# Patient Record
Sex: Female | Born: 1965 | Race: Black or African American | Hispanic: No | Marital: Married | Smoking: Never smoker
Health system: Southern US, Community
[De-identification: ages and names within clinical notes are randomized; demographics above are authoritative.]

## PROBLEM LIST (undated history)

## (undated) DIAGNOSIS — R51 Headache: Secondary | ICD-10-CM

## (undated) DIAGNOSIS — T7840XA Allergy, unspecified, initial encounter: Secondary | ICD-10-CM

## (undated) DIAGNOSIS — C50919 Malignant neoplasm of unspecified site of unspecified female breast: Secondary | ICD-10-CM

## (undated) DIAGNOSIS — Z923 Personal history of irradiation: Secondary | ICD-10-CM

## (undated) DIAGNOSIS — Z9009 Acquired absence of other part of head and neck: Secondary | ICD-10-CM

## (undated) DIAGNOSIS — K645 Perianal venous thrombosis: Secondary | ICD-10-CM

## (undated) HISTORY — DX: Headache: R51

## (undated) HISTORY — DX: Perianal venous thrombosis: K64.5

---
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
## 1998-03-16 ENCOUNTER — Other Ambulatory Visit: Admission: RE | Admit: 1998-03-16 | Discharge: 1998-03-16 | Payer: Self-pay | Admitting: Internal Medicine

## 1998-03-30 ENCOUNTER — Ambulatory Visit (HOSPITAL_COMMUNITY): Admission: RE | Admit: 1998-03-30 | Discharge: 1998-03-30 | Payer: Self-pay | Admitting: Internal Medicine

## 1998-04-02 ENCOUNTER — Other Ambulatory Visit: Admission: RE | Admit: 1998-04-02 | Discharge: 1998-04-02 | Payer: Self-pay | Admitting: General Surgery

## 1999-09-15 ENCOUNTER — Other Ambulatory Visit: Admission: RE | Admit: 1999-09-15 | Discharge: 1999-09-15 | Payer: Self-pay | Admitting: Obstetrics and Gynecology

## 2001-01-08 ENCOUNTER — Other Ambulatory Visit: Admission: RE | Admit: 2001-01-08 | Discharge: 2001-01-08 | Payer: Self-pay | Admitting: Obstetrics and Gynecology

## 2002-02-24 ENCOUNTER — Other Ambulatory Visit: Admission: RE | Admit: 2002-02-24 | Discharge: 2002-02-24 | Payer: Self-pay | Admitting: Obstetrics and Gynecology

## 2003-07-13 ENCOUNTER — Other Ambulatory Visit: Admission: RE | Admit: 2003-07-13 | Discharge: 2003-07-13 | Payer: Self-pay | Admitting: Obstetrics and Gynecology

## 2004-02-19 ENCOUNTER — Encounter: Admission: RE | Admit: 2004-02-19 | Discharge: 2004-02-19 | Payer: Self-pay | Admitting: Orthopedic Surgery

## 2004-09-09 ENCOUNTER — Encounter: Admission: RE | Admit: 2004-09-09 | Discharge: 2004-09-09 | Payer: Self-pay | Admitting: Obstetrics and Gynecology

## 2004-09-15 ENCOUNTER — Encounter: Admission: RE | Admit: 2004-09-15 | Discharge: 2004-09-15 | Payer: Self-pay | Admitting: Obstetrics and Gynecology

## 2004-11-13 DIAGNOSIS — E890 Postprocedural hypothyroidism: Secondary | ICD-10-CM

## 2004-11-13 HISTORY — DX: Postprocedural hypothyroidism: E89.0

## 2004-11-23 ENCOUNTER — Ambulatory Visit (HOSPITAL_COMMUNITY): Admission: RE | Admit: 2004-11-23 | Discharge: 2004-11-23 | Payer: Self-pay | Admitting: Internal Medicine

## 2005-07-05 ENCOUNTER — Other Ambulatory Visit: Admission: RE | Admit: 2005-07-05 | Discharge: 2005-07-05 | Payer: Self-pay | Admitting: Interventional Radiology

## 2005-07-05 ENCOUNTER — Encounter: Admission: RE | Admit: 2005-07-05 | Discharge: 2005-07-05 | Payer: Self-pay | Admitting: Endocrinology

## 2005-07-05 ENCOUNTER — Encounter (INDEPENDENT_AMBULATORY_CARE_PROVIDER_SITE_OTHER): Payer: Self-pay | Admitting: *Deleted

## 2005-11-02 ENCOUNTER — Encounter (INDEPENDENT_AMBULATORY_CARE_PROVIDER_SITE_OTHER): Payer: Self-pay | Admitting: Specialist

## 2005-11-02 ENCOUNTER — Ambulatory Visit (HOSPITAL_COMMUNITY): Admission: RE | Admit: 2005-11-02 | Discharge: 2005-11-03 | Payer: Self-pay | Admitting: General Surgery

## 2005-11-02 HISTORY — PX: THYROID SURGERY: SHX805

## 2005-11-13 HISTORY — PX: BREAST CYST EXCISION: SHX579

## 2005-12-08 ENCOUNTER — Encounter: Admission: RE | Admit: 2005-12-08 | Discharge: 2005-12-08 | Payer: Self-pay | Admitting: Obstetrics and Gynecology

## 2005-12-08 ENCOUNTER — Encounter (INDEPENDENT_AMBULATORY_CARE_PROVIDER_SITE_OTHER): Payer: Self-pay | Admitting: Specialist

## 2005-12-08 HISTORY — PX: BREAST BIOPSY: SHX20

## 2006-02-23 ENCOUNTER — Ambulatory Visit (HOSPITAL_BASED_OUTPATIENT_CLINIC_OR_DEPARTMENT_OTHER): Admission: RE | Admit: 2006-02-23 | Discharge: 2006-02-23 | Payer: Self-pay | Admitting: General Surgery

## 2006-02-23 ENCOUNTER — Encounter (INDEPENDENT_AMBULATORY_CARE_PROVIDER_SITE_OTHER): Payer: Self-pay | Admitting: *Deleted

## 2006-02-23 ENCOUNTER — Encounter: Admission: RE | Admit: 2006-02-23 | Discharge: 2006-02-23 | Payer: Self-pay | Admitting: General Surgery

## 2007-03-22 ENCOUNTER — Ambulatory Visit (HOSPITAL_COMMUNITY): Admission: RE | Admit: 2007-03-22 | Discharge: 2007-03-22 | Payer: Self-pay | Admitting: Endocrinology

## 2009-03-31 ENCOUNTER — Encounter: Admission: RE | Admit: 2009-03-31 | Discharge: 2009-03-31 | Payer: Self-pay | Admitting: Obstetrics and Gynecology

## 2009-10-06 ENCOUNTER — Encounter: Admission: RE | Admit: 2009-10-06 | Discharge: 2009-10-06 | Payer: Self-pay | Admitting: Obstetrics and Gynecology

## 2010-03-04 ENCOUNTER — Encounter: Admission: RE | Admit: 2010-03-04 | Discharge: 2010-03-04 | Payer: Self-pay | Admitting: Obstetrics and Gynecology

## 2010-03-09 ENCOUNTER — Encounter: Admission: RE | Admit: 2010-03-09 | Discharge: 2010-03-09 | Payer: Self-pay | Admitting: Obstetrics and Gynecology

## 2010-03-30 HISTORY — PX: BREAST EXCISIONAL BIOPSY: SUR124

## 2010-12-04 ENCOUNTER — Encounter: Payer: Self-pay | Admitting: Obstetrics and Gynecology

## 2011-02-03 ENCOUNTER — Ambulatory Visit: Payer: Self-pay | Admitting: Internal Medicine

## 2011-03-31 NOTE — Op Note (Signed)
Brandi Lewis, Brandi Lewis            ACCOUNT NO.:  [id number]   MEDICAL RECORD NO.:  [id number]          PATIENT TYPE:  AMB   LOCATION:  DSC                          FACILITY:  [facility]   PHYSICIAN:  Leonie Man, M.D.   DATE OF BIRTH:  09/18/1966   DATE OF PROCEDURE:  02/23/2006  DATE OF DISCHARGE:                                 OPERATIVE REPORT   PREOPERATIVE DIAGNOSIS:  Lesion, right breast.  Rule out carcinoma.   POSTOPERATIVE DIAGNOSIS:  Lesion, right breast.  Rule out carcinoma,  pathology pending.   PROCEDURE:  Lumpectomy, right breast.   SURGEON:  Leonie Man, M.D.   ASSISTANT:  Operating room technician.   ANESTHESIA:  General.   SPECIMENS:  To the pathology laboratory are breast tissue.  The lesion on  specimen mammography was seen, along with the localizing clip.   ESTIMATED BLOOD LOSS:  Minimal.   COMPLICATIONS:  None.   DISPOSITION:  Postoperatively the patient was returned to the PACU in  excellent condition.   INDICATIONS FOR PROCEDURE:  [name] is a 45 year old female with an  abnormal mammogram requiring a biopsy.  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  She comes to the operating room now for an excision of  this area with the papilloma, to rule out the possibility of a papillary  carcinoma.  She understands the risks and potential benefits of the surgery,  and gives her consent to same.   DESCRIPTION OF PROCEDURE:  Following the induction of satisfactory general  anesthesia, with the patient positioned supinely, the right breast is  prepped and draped to be included in the sterile operative field.  A  localizing needle which was placed at the breast center prior to the patient  coming to the operating room, is located in the upper outer quadrant of the  breast and films indicated a straight path to the lesion, which was located  more centrally.  A transverse incision is made adjacent to the localizing  wire, extending from the  wire towards the areola border.  This is deepened  through the skin and subcutaneous tissues.  Flaps were raised superiorly,  medially, inferiorly and laterally and dissection down into the breast  tissue, carrying the dissection down to the chest wall was carried out.  This removed the entire area of suspect breast tissue, and this was  forwarded for pathologic evaluation.  The hemostasis was then obtained. with  electrocautery and all areas of dissection checked for hemostasis and noted  to be dry.  The breast tissues were reapproximated with interrupted #2-0  Vicryl sutures.  The subcutaneous tissues were closed with #3-0 Vicryl  sutures, after the sponge, instrument and sharp counts were verified.  The  skin was closed with #5-0 Monocryl and then reinforced with Steri-Strips.  Sterile dressings applied.  The anesthetic was reversed.   The patient was removed from the operating room to the recovery room in  stable condition, having tolerated the procedure well.      Leonie Man, M.D.  Electronically Signed     PB/MEDQ  D:  02/23/2006  T:  02/23/2006  Job:  [id number]

## 2011-03-31 NOTE — Op Note (Signed)
NAMESHERONDA, Brandi Lewis            ACCOUNT NO.:  [id number]   MEDICAL RECORD NO.:  [id number]          PATIENT TYPE:  OIB   LOCATION:  1616                         FACILITY:  [facility]   PHYSICIAN:  Leonie Man, M.D.   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  Final pathology is pending.   PROCEDURE:  Thyroid exploration with isthmusectomy.   SURGEON:  Dr. Lurene Shadow   ASSISTANT:  Dr. Lebron Conners.   ANESTHESIA:  General.   Note, this patient is a 45 year old female, who presents with an enlarging  nodule in the isthmus of the thyroid extending into the left lobe of the  thyroid.  Fine-needle aspiration biopsy of this lesion shows a follicular  lesion with multiple clusters of Hurthle cells.  The patient comes to the  operating room now for left thyroid lobectomy, possible total thyroidectomy.  I discussed with her the risks and potential benefits of surgery including  the risk of hypoparathyroidism, recurrent laryngeal nerve injury, bleeding  and infection among others.  She understands and gives her consent to  surgery.   PROCEDURE:  Following the induction of satisfactory general anesthesia, the  patient is positioned supinely.  The head is slightly extended, and the neck  and chest were prepped and draped to be included in a sterile operative  field.  Transverse collar incision was carried down through the skin and  subcutaneous tissue and across the platysma muscle.  The superior flap was  raised up to the thyroid cartilage and an inferior flap carried down to the  sternal notch.  The strap muscles are opened in the midline.  The dissection  is carried over the isthmus and over  into the left lobe.  Both the right and  left lobes were thoroughly inspected.  There were no additional nodules  noted.  There were no nodes felt within the anterior compartment.  Because  the nodule was essentially occupying  the isthmus of the thyroid, I then  decided that I would remove just the isthmus with portions of the right and  left lobe to get margins around this mass.  This was done by dissecting  medially onto the thyroid and maintaining hemostasis with electrocautery and  clips.  The medial aspect of the left lobe was then elevated, and I  transected the left lobe, leaving approximately a 1 cm margin of what  appeared to be normal thyroid lateral to the lesion in the isthmus.  The  dissection was then carried over the trachea and carried into the region of  the right lobe where a similar margin of approximately 1 cm was carried over  into the right lobe.  The isthmus of the thyroid was then forwarded for  pathologic evaluation and on frozen section, this was noted to be a  follicular lesion with multiple clusters  of Hurthle cells.  There was no  evidence to suggest malignancy.  All areas of dissection within the neck  were then checked for hemostasis and noted to be dry.  Sponge and instrument  counts were verified.  I placed a small Surgicel patch over the trachea.  The midline strap muscles were then closed with interrupted sutures of 3-0  Vicryl.  The platysma muscle and subcutaneous tissues closed with  interrupted 3-0 Vicryl sutures, and the skin was closed with a 5-0  Monocryl suture and then reinforced with Steri-Strips.  Sterile dressings  were then applied, the anesthetic reversed, and the patient removed from the  operating room to the recovery room in stable condition.  She tolerated the  procedure well.      Leonie Man, M.D.  Electronically Signed     PB/MEDQ  D:  11/02/2005  T:  11/06/2005  Job:  [id number]

## 2011-06-01 ENCOUNTER — Other Ambulatory Visit: Payer: Self-pay | Admitting: Obstetrics

## 2011-06-01 DIAGNOSIS — N6325 Unspecified lump in the left breast, overlapping quadrants: Secondary | ICD-10-CM

## 2011-06-08 ENCOUNTER — Other Ambulatory Visit: Payer: Self-pay

## 2011-12-04 ENCOUNTER — Encounter (INDEPENDENT_AMBULATORY_CARE_PROVIDER_SITE_OTHER): Payer: Self-pay | Admitting: Surgery

## 2011-12-04 ENCOUNTER — Ambulatory Visit (INDEPENDENT_AMBULATORY_CARE_PROVIDER_SITE_OTHER): Payer: BC Managed Care – PPO | Admitting: Surgery

## 2011-12-04 VITALS — BP 114/68 | HR 68 | Temp 97.8°F | Resp 16 | Ht 60.0 in | Wt 109.8 lb

## 2011-12-04 DIAGNOSIS — R51 Headache: Secondary | ICD-10-CM | POA: Insufficient documentation

## 2011-12-04 DIAGNOSIS — R519 Headache, unspecified: Secondary | ICD-10-CM | POA: Insufficient documentation

## 2011-12-04 DIAGNOSIS — K645 Perianal venous thrombosis: Secondary | ICD-10-CM

## 2011-12-04 NOTE — Progress Notes (Signed)
Subjective:     Patient ID: Brandi Lewis, female   DOB: 1966/08/02, 46 y.o.   MRN: [id number]  HPI She presents with another thrombosed hemorrhoid. The developed over the weekend. She has moderate perianal discomfort  Review of Systems     Objective:   Physical Exam On exam, there is a thrombosed hemorrhoid with a large amount of edema. I anesthetized the area of lidocaine with epinephrine and drained several clots. I then packed the wound gauze    Assessment:     Thrombosed external hemorrhoid    Plan:     She will continue her sitz baths. I wrote her for Percocet lidocaine and Phenergan. I will see her back next week

## 2011-12-07 ENCOUNTER — Encounter (INDEPENDENT_AMBULATORY_CARE_PROVIDER_SITE_OTHER): Payer: Self-pay | Admitting: General Surgery

## 2011-12-13 ENCOUNTER — Ambulatory Visit (INDEPENDENT_AMBULATORY_CARE_PROVIDER_SITE_OTHER): Payer: BC Managed Care – PPO | Admitting: Surgery

## 2011-12-13 ENCOUNTER — Encounter (INDEPENDENT_AMBULATORY_CARE_PROVIDER_SITE_OTHER): Payer: Self-pay | Admitting: Surgery

## 2011-12-13 VITALS — BP 130/84 | HR 80 | Temp 98.3°F | Resp 18 | Ht 60.0 in | Wt 114.2 lb

## 2011-12-13 DIAGNOSIS — Z09 Encounter for follow-up examination after completed treatment for conditions other than malignant neoplasm: Secondary | ICD-10-CM

## 2011-12-13 NOTE — Progress Notes (Signed)
Subjective:     Patient ID: Brandi Lewis, female   DOB: June 15, 1966, 46 y.o.   MRN: [id number]  HPI She is here for a followup visit status post incision and drainage of thrombosed external hemorrhoids last week. She is still having moderate discomfort.  Review of Systems     Objective:   Physical Exam On exam, there is still swelling of the hemorrhoidal tissue but it is less than last week and there is no evidence of rethrombosis    Assessment:     Patient status post incision and drainage of thrombosed hemorrhoid    Plan:     I will start her on Analpram. She will continue sitz baths. I also reviewed for Percocet. She will also continue stool softeners. I will see her back in 2 weeks

## 2012-01-01 ENCOUNTER — Encounter (INDEPENDENT_AMBULATORY_CARE_PROVIDER_SITE_OTHER): Payer: Self-pay | Admitting: Surgery

## 2012-01-01 ENCOUNTER — Ambulatory Visit (INDEPENDENT_AMBULATORY_CARE_PROVIDER_SITE_OTHER): Payer: BC Managed Care – PPO | Admitting: Surgery

## 2012-01-01 VITALS — BP 121/77 | HR 85 | Temp 98.6°F | Resp 14 | Ht 60.0 in | Wt 109.4 lb

## 2012-01-01 DIAGNOSIS — K645 Perianal venous thrombosis: Secondary | ICD-10-CM

## 2012-01-01 NOTE — Progress Notes (Signed)
Subjective:     Patient ID: Brandi Lewis, female   DOB: 08-Apr-1966, 46 y.o.   MRN: [id number]  HPI She is here for a followup visit. She is still having discomfort with bowel movements she reports it is less. She remains on a stool softener. She's minimal narcotics  Review of Systems     Objective:   Physical Exam    On exam, there is some skin tagging but otherwise minimal swelling and no open wounds Assessment:     Patient status post incision and drainage of thrombosed external hemorrhoid    Plan:     I wrote her for nitroglycerin ointment. She will continue stool softeners. I will see her back as needed and

## 2012-08-21 ENCOUNTER — Other Ambulatory Visit: Payer: Self-pay

## 2012-08-21 DIAGNOSIS — Z1231 Encounter for screening mammogram for malignant neoplasm of breast: Secondary | ICD-10-CM

## 2012-08-27 ENCOUNTER — Emergency Department (HOSPITAL_COMMUNITY)
Admission: EM | Admit: 2012-08-27 | Discharge: 2012-08-27 | Disposition: A | Discharge status: Home / Self Care | Payer: BC Managed Care – PPO | Attending: Emergency Medicine | Admitting: Emergency Medicine

## 2012-08-27 ENCOUNTER — Encounter (HOSPITAL_COMMUNITY): Payer: Self-pay | Admitting: Emergency Medicine

## 2012-08-27 DIAGNOSIS — N949 Unspecified condition associated with female genital organs and menstrual cycle: Secondary | ICD-10-CM | POA: Insufficient documentation

## 2012-08-27 DIAGNOSIS — N912 Amenorrhea, unspecified: Secondary | ICD-10-CM | POA: Insufficient documentation

## 2012-08-27 DIAGNOSIS — R102 Pelvic and perineal pain unspecified side: Secondary | ICD-10-CM

## 2012-08-27 DIAGNOSIS — R10816 Epigastric abdominal tenderness: Secondary | ICD-10-CM | POA: Insufficient documentation

## 2012-08-27 DIAGNOSIS — R109 Unspecified abdominal pain: Secondary | ICD-10-CM | POA: Insufficient documentation

## 2012-08-27 DIAGNOSIS — Z79899 Other long term (current) drug therapy: Secondary | ICD-10-CM | POA: Insufficient documentation

## 2012-08-27 LAB — WET PREP, GENITAL
Trich, Wet Prep: NONE SEEN
Yeast Wet Prep HPF POC: NONE SEEN

## 2012-08-27 LAB — CBC WITH DIFFERENTIAL/PLATELET
Basophils Absolute: 0.1 10*3/uL (ref 0.0–0.1)
Basophils Relative: 1 % (ref 0–1)
Eosinophils Relative: 1 % (ref 0–5)
HCT: 43.5 % (ref 36.0–46.0)
MCHC: 35.2 g/dL (ref 30.0–36.0)
MCV: 86.8 fL (ref 78.0–100.0)
Monocytes Absolute: 0.4 10*3/uL (ref 0.1–1.0)
Neutro Abs: 3.3 10*3/uL (ref 1.7–7.7)
RDW: 13.1 % (ref 11.5–15.5)

## 2012-08-27 LAB — URINALYSIS, MICROSCOPIC ONLY
Bilirubin Urine: NEGATIVE
Ketones, ur: NEGATIVE mg/dL
Nitrite: NEGATIVE
Protein, ur: NEGATIVE mg/dL
Urobilinogen, UA: 0.2 mg/dL (ref 0.0–1.0)

## 2012-08-27 LAB — BASIC METABOLIC PANEL
BUN: 9 mg/dL (ref 6–23)
CO2: 21 mEq/L (ref 19–32)
Chloride: 103 mEq/L (ref 96–112)
Creatinine, Ser: 0.92 mg/dL (ref 0.50–1.10)

## 2012-08-27 NOTE — ED Notes (Signed)
Pt states that she has been having lower abd cramping for 2-3 days.  Pt does not get periods due to the birth control she is on.  Denies frequency/dysuria.  Denies NVD.

## 2012-08-27 NOTE — ED Provider Notes (Signed)
History     CSN: [id number]  Arrival date & time 08/27/12  1745   First MD Initiated Contact with Patient 08/27/12 2017      Chief Complaint  Patient presents with  . Abdominal Pain    (Consider location/radiation/quality/duration/timing/severity/associated sxs/prior treatment) HPI Complains of crampy suprapubic pain, nonradiating onset 3 days ago pain for a few seconds at a time goes away for several minutes. No anorexia no vomiting. Last bowel movement today, normal pain is slightly improved today over yesterday. No fever nothing makes symptoms better or worse no treatment prior to coming here patient is pain-free presently. No vaginal discharge no urinary symptoms no other associated symptoms Past Medical History  Diagnosis Date  . Headache     migraine  . Thrombosed hemorrhoids     Past Surgical History  Procedure Date  . Breast cyst excision 2007    rt breast  . Thyroid surgery 11/02/2005    thyroid exploration with isthmusectomy    Family History  Problem Relation Age of Onset  . Hypertension Mother   . Cancer Father     bone, prostate  . Cancer Maternal Grandfather     colon    History  Substance Use Topics  . Smoking status: Never Smoker   . Smokeless tobacco: Never Used  . Alcohol Use: No    OB History    Grav Para Term Preterm Abortions TAB SAB Ect Mult Living                  Review of Systems  Constitutional: Negative.   HENT: Negative.   Respiratory: Negative.   Cardiovascular: Negative.   Gastrointestinal: Positive for abdominal pain.  Genitourinary:       Amenorrhea  Musculoskeletal: Negative.   Skin: Negative.   Neurological: Negative.   Hematological: Negative.   Psychiatric/Behavioral: Negative.   All other systems reviewed and are negative.    Allergies  Codeine  Home Medications   Current Outpatient Rx  Name Route Sig Dispense Refill  . NORETHINDRONE ACET-ETHINYL EST 1-20 MG-MCG PO TABS Oral Take 1 tablet by mouth  daily.    . TOPIRAMATE 25 MG PO TABS Oral Take 75 mg by mouth daily.       BP 125/72  Pulse 91  Temp 98.7 F (37.1 C) (Oral)  Resp 21  SpO2 100%  Physical Exam  Nursing note and vitals reviewed. Constitutional: She appears well-developed and well-nourished.  HENT:  Head: Normocephalic and atraumatic.  Eyes: Conjunctivae normal are normal. Pupils are equal, round, and reactive to light.  Neck: Neck supple. No tracheal deviation present. No thyromegaly present.  Cardiovascular: Normal rate and regular rhythm.   No murmur heard. Pulmonary/Chest: Effort normal and breath sounds normal.  Abdominal: Soft. Bowel sounds are normal. She exhibits no distension.       Minimal suprapubic tenderness  Genitourinary:       No external lesion.   No CMT os closed . No adnexal masses or tenderness  Musculoskeletal: Normal range of motion. She exhibits no edema and no tenderness.  Neurological: She is alert. Coordination normal.  Skin: Skin is warm and dry. No rash noted.  Psychiatric: She has a normal mood and affect.    ED Course  Procedures (including critical care time)  Labs Reviewed  CBC WITH DIFFERENTIAL - Abnormal; Notable for the following:    Hemoglobin 15.3 (*)     All other components within normal limits  URINALYSIS, MICROSCOPIC ONLY - Abnormal; Notable for the following:  APPearance CLOUDY (*)     Leukocytes, UA MODERATE (*)     Bacteria, UA MANY (*)     Squamous Epithelial / LPF FEW (*)     All other components within normal limits  BASIC METABOLIC PANEL - Abnormal; Notable for the following:    GFR calc non Af Amer 73 (*)     GFR calc Af Amer 85 (*)     All other components within normal limits  POCT PREGNANCY, URINE   No results found. Results for orders placed during the hospital encounter of 08/27/12  CBC WITH DIFFERENTIAL      Component Value Range   WBC 6.1  4.0 - 10.5 K/uL   RBC 5.01  3.87 - 5.11 MIL/uL   Hemoglobin 15.3 (*) 12.0 - 15.0 g/dL   HCT 16.1   09.6 - 04.5 %   MCV 86.8  78.0 - 100.0 fL   MCH 30.5  26.0 - 34.0 pg   MCHC 35.2  30.0 - 36.0 g/dL   RDW 40.9  81.1 - 91.4 %   Platelets 234  150 - 400 K/uL   Neutrophils Relative 55  43 - 77 %   Neutro Abs 3.3  1.7 - 7.7 K/uL   Lymphocytes Relative 37  12 - 46 %   Lymphs Abs 2.2  0.7 - 4.0 K/uL   Monocytes Relative 6  3 - 12 %   Monocytes Absolute 0.4  0.1 - 1.0 K/uL   Eosinophils Relative 1  0 - 5 %   Eosinophils Absolute 0.1  0.0 - 0.7 K/uL   Basophils Relative 1  0 - 1 %   Basophils Absolute 0.1  0.0 - 0.1 K/uL  URINALYSIS, MICROSCOPIC ONLY      Component Value Range   Color, Urine YELLOW  YELLOW   APPearance CLOUDY (*) CLEAR   Specific Gravity, Urine 1.010  1.005 - 1.030   pH 6.5  5.0 - 8.0   Glucose, UA NEGATIVE  NEGATIVE mg/dL   Hgb urine dipstick NEGATIVE  NEGATIVE   Bilirubin Urine NEGATIVE  NEGATIVE   Ketones, ur NEGATIVE  NEGATIVE mg/dL   Protein, ur NEGATIVE  NEGATIVE mg/dL   Urobilinogen, UA 0.2  0.0 - 1.0 mg/dL   Nitrite NEGATIVE  NEGATIVE   Leukocytes, UA MODERATE (*) NEGATIVE   WBC, UA 3-6  <3 WBC/hpf   Bacteria, UA MANY (*) RARE   Squamous Epithelial / LPF FEW (*) RARE  BASIC METABOLIC PANEL      Component Value Range   Sodium 136  135 - 145 mEq/L   Potassium 3.7  3.5 - 5.1 mEq/L   Chloride 103  96 - 112 mEq/L   CO2 21  19 - 32 mEq/L   Glucose, Bld 88  70 - 99 mg/dL   BUN 9  6 - 23 mg/dL   Creatinine, Ser 7.82  0.50 - 1.10 mg/dL   Calcium 9.6  8.4 - 95.6 mg/dL   GFR calc non Af Amer 73 (*) >90 mL/min   GFR calc Af Amer 85 (*) >90 mL/min  POCT PREGNANCY, URINE      Component Value Range   Preg Test, Ur NEGATIVE  NEGATIVE  WET PREP, GENITAL      Component Value Range   Yeast Wet Prep HPF POC NONE SEEN  NONE SEEN   Trich, Wet Prep NONE SEEN  NONE SEEN   Clue Cells Wet Prep HPF POC NONE SEEN  NONE SEEN   WBC, Wet Prep  HPF POC RARE (*) NONE SEEN   No results found.   No diagnosis found.   10:25 PM patient resting comfortably, asymptomatic MDM    Pain felt to be nonspecific plan ibuprofen for pain. She is to keep her scheduled appointment with [facility] in 10 days Medicine nonspecific pelvic pain        Doug Sou, MD 08/27/12 2231

## 2012-09-18 ENCOUNTER — Ambulatory Visit
Admission: RE | Admit: 2012-09-18 | Discharge: 2012-09-18 | Disposition: A | Discharge status: Home / Self Care | Payer: BC Managed Care – PPO | Source: Ambulatory Visit

## 2012-09-18 DIAGNOSIS — Z1231 Encounter for screening mammogram for malignant neoplasm of breast: Secondary | ICD-10-CM

## 2013-04-18 ENCOUNTER — Emergency Department (HOSPITAL_COMMUNITY)
Admission: EM | Admit: 2013-04-18 | Discharge: 2013-04-18 | Disposition: A | Discharge status: Home / Self Care | Payer: BC Managed Care – PPO | Attending: Emergency Medicine | Admitting: Emergency Medicine

## 2013-04-18 ENCOUNTER — Encounter (HOSPITAL_COMMUNITY): Payer: Self-pay | Admitting: *Deleted

## 2013-04-18 DIAGNOSIS — G43909 Migraine, unspecified, not intractable, without status migrainosus: Secondary | ICD-10-CM | POA: Insufficient documentation

## 2013-04-18 DIAGNOSIS — Z8679 Personal history of other diseases of the circulatory system: Secondary | ICD-10-CM | POA: Insufficient documentation

## 2013-04-18 DIAGNOSIS — Z79899 Other long term (current) drug therapy: Secondary | ICD-10-CM | POA: Insufficient documentation

## 2013-04-18 DIAGNOSIS — R110 Nausea: Secondary | ICD-10-CM | POA: Insufficient documentation

## 2013-04-18 MED ORDER — SODIUM CHLORIDE 0.9 % IV BOLUS (SEPSIS)
1000.0000 mL | Freq: Once | INTRAVENOUS | Status: AC
  Administered 2013-04-18: 1000 mL via INTRAVENOUS

## 2013-04-18 MED ORDER — DEXAMETHASONE SODIUM PHOSPHATE 10 MG/ML IJ SOLN
10.0000 mg | Freq: Once | INTRAMUSCULAR | Status: AC
  Administered 2013-04-18: 10 mg via INTRAVENOUS
  Filled 2013-04-18: qty 1

## 2013-04-18 MED ORDER — KETOROLAC TROMETHAMINE 30 MG/ML IJ SOLN
15.0000 mg | Freq: Once | INTRAMUSCULAR | Status: AC
  Administered 2013-04-18: 15 mg via INTRAVENOUS
  Filled 2013-04-18: qty 1

## 2013-04-18 MED ORDER — PROCHLORPERAZINE MALEATE 10 MG PO TABS
10.0000 mg | ORAL_TABLET | Freq: Once | ORAL | Status: AC
  Administered 2013-04-18: 10 mg via ORAL
  Filled 2013-04-18: qty 1

## 2013-04-18 MED ORDER — DIPHENHYDRAMINE HCL 50 MG/ML IJ SOLN
12.5000 mg | Freq: Once | INTRAMUSCULAR | Status: AC
  Administered 2013-04-18: 12.5 mg via INTRAVENOUS
  Filled 2013-04-18: qty 1

## 2013-04-18 NOTE — ED Provider Notes (Signed)
History     CSN: [id number]  Arrival date & time 04/18/13  1118   First MD Initiated Contact with Patient 04/18/13 1125      Chief Complaint  Patient presents with  . Migraine    (Consider location/radiation/quality/duration/timing/severity/associated sxs/prior treatment) HPI Comments: Pt presents with her typical migraine symptoms.  Headache began 3 days ago, gradual onset, located over right side of face with radiation down the right side of her neck.  Associated nausea began today.  Take topamax daily and took Macoven without relief.  Denies any change from her typical headache pattern.  Believes she may have triggered this headache with strong coffee.  Denies fevers, chills, body aches, neck stiffness, head trauma, recent illness.  Sees Dr Vela Prose for her migraines, has been referred to [facility].   Patient is a 47 y.o. female presenting with migraines. The history is provided by the patient.  Migraine Associated symptoms include headaches. Pertinent negatives include no abdominal pain, chest pain, congestion, coughing, fever, nausea, numbness, sore throat, vomiting or weakness.    Past Medical History  Diagnosis Date  . Headache(784.0)     migraine  . Thrombosed hemorrhoids     Past Surgical History  Procedure Laterality Date  . Breast cyst excision  2007    rt breast  . Thyroid surgery  11/02/2005    thyroid exploration with isthmusectomy    Family History  Problem Relation Age of Onset  . Hypertension Mother   . Cancer Father     bone, prostate  . Cancer Maternal Grandfather     colon    History  Substance Use Topics  . Smoking status: Never Smoker   . Smokeless tobacco: Never Used  . Alcohol Use: No    OB History   Grav Para Term Preterm Abortions TAB SAB Ect Mult Living                  Review of Systems  Constitutional: Negative for fever.  HENT: Negative for ear pain, congestion, sore throat and sinus pressure.   Respiratory:  Negative for cough and shortness of breath.   Cardiovascular: Negative for chest pain.  Gastrointestinal: Negative for nausea, vomiting, abdominal pain and diarrhea.  Genitourinary: Negative for dysuria, urgency, frequency, vaginal bleeding and vaginal discharge.  Neurological: Positive for headaches. Negative for dizziness, weakness, light-headedness and numbness.    Allergies  Codeine  Home Medications   Current Outpatient Rx  Name  Route  Sig  Dispense  Refill  . norethindrone-ethinyl estradiol (MICROGESTIN,JUNEL,LOESTRIN) 1-20 MG-MCG tablet   Oral   Take 1 tablet by mouth daily.         Marland Kitchen topiramate (TOPAMAX) 25 MG tablet   Oral   Take 75 mg by mouth daily.            BP 141/78  Pulse 88  Temp(Src) 98.1 F (36.7 C) (Oral)  Resp 18  SpO2 100%  Physical Exam  Nursing note and vitals reviewed. Constitutional: She appears well-developed and well-nourished. No distress.  HENT:  Head: Normocephalic and atraumatic.  Neck: Neck supple.  Cardiovascular: Normal rate and regular rhythm.   Pulmonary/Chest: Effort normal and breath sounds normal. No respiratory distress. She has no wheezes. She has no rales.  Abdominal: Soft. She exhibits no distension. There is no tenderness. There is no rebound and no guarding.  Neurological: She is alert. No cranial nerve deficit. She exhibits normal muscle tone. Coordination and gait normal. GCS eye subscore is 4. GCS verbal  subscore is 5. GCS motor subscore is 6.  CN II-XII intact, EOMs intact, no pronator drift, grip strengths equal bilaterally; strength 5/5 in all extremities, sensation intact in all extremities; finger to nose, heel to shin, rapid alternating movements normal; gait is normal.     Skin: She is not diaphoretic.  Psychiatric: She has a normal mood and affect. Her behavior is normal.    ED Course  Procedures (including critical care time)  Labs Reviewed - No data to display No results found.   1:28 PM Pain has  improved from 8/10 to 5/10.   Pain 2/10 prior to discharge.  Pt feeling much better, requests discharge.   1. Migraine       MDM  Pt with hx migraines presents with three days of same, unrelieved by her home medications.  No neuro deficits.  No red flags.  Pt given IVF and migraine cocktail with improvement of symptoms.    Pt given return precautions.  Pt verbalizes understanding and agrees with plan.           Trixie Dredge, PA-C 04/18/13 1515

## 2013-04-18 NOTE — ED Notes (Signed)
Pt reports severe migraine x 3 days with nausea. Denies vomiting. Hx of migraines.

## 2013-04-18 NOTE — ED Provider Notes (Signed)
Medical screening examination/treatment/procedure(s) were performed by non-physician practitioner and as supervising physician I was immediately available for consultation/collaboration.   Gwyneth Sprout, MD 04/18/13 [phone]

## 2014-07-22 ENCOUNTER — Other Ambulatory Visit: Payer: Self-pay

## 2014-07-22 DIAGNOSIS — Z1231 Encounter for screening mammogram for malignant neoplasm of breast: Secondary | ICD-10-CM

## 2014-07-28 ENCOUNTER — Ambulatory Visit
Admission: RE | Admit: 2014-07-28 | Discharge: 2014-07-28 | Disposition: A | Discharge status: Home / Self Care | Payer: BC Managed Care – PPO | Source: Ambulatory Visit

## 2014-07-28 DIAGNOSIS — Z1231 Encounter for screening mammogram for malignant neoplasm of breast: Secondary | ICD-10-CM

## 2015-01-31 ENCOUNTER — Ambulatory Visit (INDEPENDENT_AMBULATORY_CARE_PROVIDER_SITE_OTHER): Payer: BLUE CROSS/BLUE SHIELD | Admitting: Family Medicine

## 2015-01-31 ENCOUNTER — Ambulatory Visit (INDEPENDENT_AMBULATORY_CARE_PROVIDER_SITE_OTHER): Payer: BLUE CROSS/BLUE SHIELD

## 2015-01-31 VITALS — BP 118/84 | HR 91 | Temp 98.3°F | Resp 16 | Ht 65.0 in | Wt 116.0 lb

## 2015-01-31 DIAGNOSIS — Z1329 Encounter for screening for other suspected endocrine disorder: Secondary | ICD-10-CM

## 2015-01-31 DIAGNOSIS — R300 Dysuria: Secondary | ICD-10-CM

## 2015-01-31 DIAGNOSIS — K5909 Other constipation: Secondary | ICD-10-CM | POA: Diagnosis not present

## 2015-01-31 DIAGNOSIS — R109 Unspecified abdominal pain: Secondary | ICD-10-CM

## 2015-01-31 LAB — POCT URINALYSIS DIPSTICK
Bilirubin, UA: NEGATIVE
Glucose, UA: NEGATIVE
LEUKOCYTES UA: NEGATIVE
Nitrite, UA: NEGATIVE
Protein, UA: NEGATIVE
SPEC GRAV UA: 1.025
UROBILINOGEN UA: 0.2
pH, UA: 5.5

## 2015-01-31 LAB — POCT UA - MICROSCOPIC ONLY
CASTS, UR, LPF, POC: NEGATIVE
CRYSTALS, UR, HPF, POC: NEGATIVE
Mucus, UA: NEGATIVE
RBC, URINE, MICROSCOPIC: NEGATIVE
WBC, Ur, HPF, POC: NEGATIVE
YEAST UA: NEGATIVE

## 2015-01-31 LAB — POCT CBC
Granulocyte percent: 60.3 % (ref 37–80)
HCT, POC: 47 % (ref 37.7–47.9)
Hemoglobin: 15.1 g/dL (ref 12.2–16.2)
Lymph, poc: 2.3 (ref 0.6–3.4)
MCH, POC: 29.5 pg (ref 27–31.2)
MCHC: 32.1 g/dL (ref 31.8–35.4)
MCV: 91.8 fL (ref 80–97)
MID (cbc): 0.2 (ref 0–0.9)
MPV: 7.8 fL (ref 0–99.8)
POC Granulocyte: 3.9 (ref 2–6.9)
POC LYMPH PERCENT: 36.4 % (ref 10–50)
POC MID %: 3.3 % (ref 0–12)
Platelet Count, POC: 243 10*3/uL (ref 142–424)
RBC: 5.11 M/uL (ref 4.04–5.48)
RDW, POC: 13.2 %
WBC: 6.4 10*3/uL (ref 4.6–10.2)

## 2015-01-31 MED ORDER — KETOROLAC TROMETHAMINE 60 MG/2ML IM SOLN
60.0000 mg | Freq: Once | INTRAMUSCULAR | Status: AC
  Administered 2015-01-31: 60 mg via INTRAMUSCULAR

## 2015-01-31 MED ORDER — LACTULOSE 20 GM/30ML PO SOLN: 20.0000 g | Freq: Two times a day (BID) | ORAL | Status: DC | PRN

## 2015-01-31 MED ORDER — TAMSULOSIN HCL 0.4 MG PO CAPS: 0.4000 mg | ORAL_CAPSULE | Freq: Every day | ORAL | Status: DC

## 2015-01-31 MED ORDER — TRAMADOL HCL 50 MG PO TABS: 50.0000 mg | ORAL_TABLET | Freq: Three times a day (TID) | ORAL | Status: DC | PRN

## 2015-01-31 NOTE — Patient Instructions (Addendum)
Abdominal Pain Many things can cause belly (abdominal) pain. Most times, the belly pain is not dangerous. Many cases of belly pain can be watched and treated at home. HOME CARE   Do not take medicines that help you go poop (laxatives) unless told to by your doctor.  Only take medicine as told by your doctor.  Eat or drink as told by your doctor. Your doctor will tell you if you should be on a special diet. GET HELP IF:  You do not know what is causing your belly pain.  You have belly pain while you are sick to your stomach (nauseous) or have runny poop (diarrhea).  You have pain while you pee or poop.  Your belly pain wakes you up at night.  You have belly pain that gets worse or better when you eat.  You have belly pain that gets worse when you eat fatty foods.  You have a fever. GET HELP RIGHT AWAY IF:   The pain does not go away within 2 hours.  You keep throwing up (vomiting).  The pain changes and is only in the right or left part of the belly.  You have bloody or tarry looking poop. MAKE SURE YOU:   Understand these instructions.  Will watch your condition.  Will get help right away if you are not doing well or get worse. Document Released: 04/17/2008 Document Revised: 11/04/2013 Document Reviewed: 07/09/2013 [facility] Patient Information 2015 [place], [place]. This information is not intended to replace advice given to you by your health care provider. Make sure you discuss any questions you have with your health care provider. Ureteral Colic (Kidney Stones) Ureteral colic is the result of a condition when kidney stones form inside the kidney. Once kidney stones are formed they may move into the tube that connects the kidney with the bladder (ureter). If this occurs, this condition may cause pain (colic) in the ureter.  CAUSES  Pain is caused by stone movement in the ureter and the obstruction caused by the stone. SYMPTOMS  The pain comes and goes as the ureter  contracts around the stone. The pain is usually intense, sharp, and stabbing in character. The location of the pain may move as the stone moves through the ureter. When the stone is near the kidney the pain is usually located in the back and radiates to the belly (abdomen). When the stone is ready to pass into the bladder the pain is often located in the lower abdomen on the side the stone is located. At this location, the symptoms may mimic those of a urinary tract infection with urinary frequency. Once the stone is located here it often passes into the bladder and the pain disappears completely. TREATMENT   Your caregiver will provide you with medicine for pain relief.  You may require specialized follow-up X-rays.  The absence of pain does not always mean that the stone has passed. It may have just stopped moving. If the urine remains completely obstructed, it can cause loss of kidney function or even complete destruction of the involved kidney. It is your responsibility and in your interest that X-rays and follow-ups as suggested by your caregiver are completed. Relief of pain without passage of the stone can be associated with severe damage to the kidney, including loss of kidney function on that side.  If your stone does not pass on its own, additional measures may be taken by your caregiver to ensure its removal. HOME CARE INSTRUCTIONS   Increase  your fluid intake. Water is the preferred fluid since juices containing vitamin C may acidify the urine making it less likely for certain stones (uric acid stones) to pass.  Strain all urine. A strainer will be provided. Keep all particulate matter or stones for your caregiver to inspect.  Take your pain medicine as directed.  Make a follow-up appointment with your caregiver as directed.  Remember that the goal is passage of your stone. The absence of pain does not mean the stone is gone. Follow your caregiver's instructions.  Only take  over-the-counter or prescription medicines for pain, discomfort, or fever as directed by your caregiver. SEEK MEDICAL CARE IF:   Pain cannot be controlled with the prescribed medicine.  You have a fever.  Pain continues for longer than your caregiver advises it should.  There is a change in the pain, and you develop chest discomfort or constant abdominal pain.  You feel faint or pass out. MAKE SURE YOU:   Understand these instructions.  Will watch your condition.  Will get help right away if you are not doing well or get worse. Document Released: 08/09/2005 Document Revised: 02/24/2013 Document Reviewed: 04/26/2011 [facility] Patient Information 2015 [place], [place]. This information is not intended to replace advice given to you by your health care provider. Make sure you discuss any questions you have with your health care provider.

## 2015-01-31 NOTE — Progress Notes (Addendum)
Chief Complaint:  Chief Complaint  Patient presents with  . Abdominal Pain  . Dysuria    HPI: Brandi Lewis is a 49 y.o. female who is here for abdominal pain for about 2-3 weeks. It is slightly sharp today at work so she is here to get evaluated. She has had some burning sensation in her pelvic area when she urinates... She has had a pap smear in January , normal. She had  A normal mammogram as well. She has a history of constipation. Last BM was yesterday and was normal, she has been able to pass flatus. She denies any kidney stones or ovarian cysts. She has no vaginal discharge denies any STDs. She had some point an abnormal Pap before her son was born over 52 years ago and had some procedure done but she does not remember what it was. Her recent Paps,  the latest one in January with Dr. Garwin Brothers was normal. No fevers chills nausea vomiting. She is here today because she's had this problem for about 2-3 weeks and had sharp pain in her pelvic area. She is no longer having menstrual cycles since she is on continuous birth control for her migraine headaches.. She denies any bloody stools, nausea, vomiting, abdominal distention, night sweats, unintentional weight loss, stool changes. Denies any fevers or chills. She has thyroid disease but does not remember when the last time she got it checked what the numbers were. She had a thyroid cyst drained/possible removal for some parts of her thyroid but is not quite sure if they took out all or part of her thyroid.. She has a history of migraine headaches and is on Topamax, also takes a muscle relaxer as needed    Past Medical History  Diagnosis Date  . Headache(784.0)     migraine  . Thrombosed hemorrhoids    Past Surgical History  Procedure Laterality Date  . Breast cyst excision  2007    rt breast  . Thyroid surgery  11/02/2005    thyroid exploration with isthmusectomy   History   Social History  . Marital Status: Married    Spouse Name: N/A  . Number of Children: N/A  . Years of Education: N/A   Social History Main Topics  . Smoking status: Never Smoker   . Smokeless tobacco: Never Used  . Alcohol Use: No  . Drug Use: No  . Sexual Activity: Not on file   Other Topics Concern  . None   Social History Narrative   Family History  Problem Relation Age of Onset  . Hypertension Mother   . Cancer Father     bone, prostate  . Cancer Maternal Grandfather     colon   Allergies  Allergen Reactions  . Codeine Nausea And Vomiting   Prior to Admission medications   Medication Sig Start Date End Date Taking? Authorizing Provider  norethindrone-ethinyl estradiol (MICROGESTIN,JUNEL,LOESTRIN) 1-20 MG-MCG tablet Take 1 tablet by mouth daily.   Yes Historical Provider, MD  topiramate (TOPAMAX) 25 MG tablet Take 100 mg by mouth daily.    Yes Historical Provider, MD  traMADol (ULTRAM) 50 MG tablet Take 150 mg by mouth every 6 (six) hours as needed.   Yes Historical Provider, MD     ROS: The patient denies fevers, chills, night sweats, unintentional weight loss, chest pain, palpitations, wheezing, dyspnea on exertion, nausea, vomiting, hematuria, melena, numbness, weakness, or tingling.  All other systems have been reviewed and were otherwise negative with the  exception of those mentioned in the HPI and as above.    PHYSICAL EXAM: Filed Vitals:   01/31/15 1441  BP: 118/84  Pulse: 91  Temp: 98.3 F (36.8 C)  Resp: 16   Filed Vitals:   01/31/15 1441  Height: 5\' 5"  (1.651 m)  Weight: 116 lb (52.617 kg)   Body mass index is 19.3 kg/(m^2).  General: Alert, no acute distress HEENT:  Normocephalic, atraumatic, oropharynx patent. EOMI, PERRLA Cardiovascular:  Regular rate and rhythm, no rubs murmurs or gallops.  No Carotid bruits, radial pulse intact. No pedal edema.  Respiratory: Clear to auscultation bilaterally.  No wheezes, rales, or rhonchi.  No cyanosis, no use of accessory musculature GI: No  organomegaly, abdomen is soft, positive bowel sounds.  Minimally -nontender diffusely lower abd. No masses. Skin: No rashes. Neurologic: Facial musculature symmetric. Psychiatric: Patient is appropriate throughout our interaction. Lymphatic: No cervical lymphadenopathy Musculoskeletal: Gait intact.   LABS: Results for orders placed or performed in visit on 01/31/15  POCT urinalysis dipstick  Result Value Ref Range   Color, UA yellow    Clarity, UA clear    Glucose, UA neg    Bilirubin, UA neg    Ketones, UA trace    Spec Grav, UA 1.025    Blood, UA trace-lysed    pH, UA 5.5    Protein, UA neg    Urobilinogen, UA 0.2    Nitrite, UA neg    Leukocytes, UA Negative   POCT UA - Microscopic Only  Result Value Ref Range   WBC, Ur, HPF, POC neg    RBC, urine, microscopic neg    Bacteria, U Microscopic trace    Mucus, UA neg    Epithelial cells, urine per micros 0-1    Crystals, Ur, HPF, POC neg    Casts, Ur, LPF, POC neg    Yeast, UA neg   POCT CBC  Result Value Ref Range   WBC 6.4 4.6 - 10.2 K/uL   Lymph, poc 2.3 0.6 - 3.4   POC LYMPH PERCENT 36.4 10 - 50 %L   MID (cbc) 0.2 0 - 0.9   POC MID % 3.3 0 - 12 %M   POC Granulocyte 3.9 2 - 6.9   Granulocyte percent 60.3 37 - 80 %G   RBC 5.11 4.04 - 5.48 M/uL   Hemoglobin 15.1 12.2 - 16.2 g/dL   HCT, POC 47.0 37.7 - 47.9 %   MCV 91.8 80 - 97 fL   MCH, POC 29.5 27 - 31.2 pg   MCHC 32.1 31.8 - 35.4 g/dL   RDW, POC 13.2 %   Platelet Count, POC 243 142 - 424 K/uL   MPV 7.8 0 - 99.8 fL     EKG/XRAY:   Primary read interpreted by Dr. Marin Comment at [facility]. ? Right sided ureteral stone vs phlebolith   ASSESSMENT/PLAN: Encounter Diagnoses  Name Primary?  . Dysuria Yes  . Abdominal pain, unspecified abdominal location   . Screening for thyroid disorder Constipation     Brandi Lewis is a pleasant 49 year old African-American female with a past medical history of migraine headaches, constipation, thyroid surgery was here for a 2 to  three-week history of lower diffuse abdominal pain. Questionable ureteral stones on xray and also constipation She has tried MiraLAX and Senokot S in the past without much success, she would like to try lactulose as needed at this time. Prescribed Flomax, urine strainer She was given 1 dose of Toradol 60 mg IM in the  office, she wanted something for pain control and so will give her a short trial of tramadol in case she needs it. Precautions for worsening constipation given. Advised to push fluids Follow-up with labs and official x-ray results.  Gross sideeffects, risk and benefits, and alternatives of medications d/w patient. Patient is aware that all medications have potential sideeffects and we are unable to predict every sideeffect or drug-drug interaction that may occur.  Davius Goudeau, [employer], DO 01/31/2015 4:44 PM

## 2015-02-01 LAB — COMPLETE METABOLIC PANEL WITHOUT GFR
Albumin: 4.5 g/dL (ref 3.5–5.2)
BUN: 12 mg/dL (ref 6–23)
CO2: 22 meq/L (ref 19–32)
Calcium: 9.9 mg/dL (ref 8.4–10.5)
Creat: 0.99 mg/dL (ref 0.50–1.10)
GFR, Est African American: 77 mL/min
GFR, Est Non African American: 67 mL/min
Glucose, Bld: 98 mg/dL (ref 70–99)

## 2015-02-01 LAB — LIPASE: Lipase: 37 U/L (ref 0–75)

## 2015-02-01 LAB — COMPLETE METABOLIC PANEL WITH GFR
ALT: 12 U/L (ref 0–35)
AST: 15 U/L (ref 0–37)
Alkaline Phosphatase: 42 U/L (ref 39–117)
Chloride: 106 mEq/L (ref 96–112)
Potassium: 4.3 mEq/L (ref 3.5–5.3)
Sodium: 138 mEq/L (ref 135–145)
Total Bilirubin: 0.6 mg/dL (ref 0.2–1.2)
Total Protein: 7.4 g/dL (ref 6.0–8.3)

## 2015-02-01 LAB — TSH: TSH: 0.952 u[IU]/mL (ref 0.350–4.500)

## 2015-02-08 ENCOUNTER — Telehealth: Payer: Self-pay

## 2015-02-08 NOTE — Telephone Encounter (Signed)
Please call patient at [phone]  Call after two oclock - What does Dr. Marin Comment want the patient to do?

## 2015-02-08 NOTE — Telephone Encounter (Signed)
Notes Recorded by Ritta Slot on 02/05/2015 at 10:14 AM Gave pt results. She is still having pain, she took the laxative and had a bowel movement, but she is still having pain. Please advise.   Please advise.

## 2015-02-09 NOTE — Telephone Encounter (Signed)
Lm that we can do [place] abd and pelvis vs CT abd and pelvis, she has lower abd pain, she has no periods sicn eon continuous birth control, she did have hematuria on UA it would be to rule out kidney stones, fibroids, ovarian cysts. I do not suspectit is appendicitis or GB in origin

## 2015-02-10 ENCOUNTER — Telehealth: Payer: Self-pay

## 2015-02-10 NOTE — Telephone Encounter (Signed)
Pt is calling dr Marin Comment back to let her know she would like to have abdomen ultrasound

## 2015-02-13 ENCOUNTER — Other Ambulatory Visit: Payer: Self-pay | Admitting: Family Medicine

## 2015-02-13 DIAGNOSIS — M25559 Pain in unspecified hip: Secondary | ICD-10-CM

## 2015-02-13 DIAGNOSIS — R1030 Lower abdominal pain, unspecified: Secondary | ICD-10-CM

## 2015-02-13 NOTE — Telephone Encounter (Signed)
Gave pt message. Pt will be going OOT 4/7-4/12.

## 2015-02-13 NOTE — Telephone Encounter (Signed)
I have ordered abd and pelvic US for her, if she doe snot hear from our office about her appt in 1 week then need to call us back.

## 2015-02-15 ENCOUNTER — Other Ambulatory Visit: Payer: Self-pay | Admitting: Family Medicine

## 2015-02-15 DIAGNOSIS — R1030 Lower abdominal pain, unspecified: Secondary | ICD-10-CM

## 2015-02-15 DIAGNOSIS — M25559 Pain in unspecified hip: Secondary | ICD-10-CM

## 2015-03-01 ENCOUNTER — Ambulatory Visit
Admission: RE | Admit: 2015-03-01 | Discharge: 2015-03-01 | Disposition: A | Discharge status: Home / Self Care | Payer: BLUE CROSS/BLUE SHIELD | Source: Ambulatory Visit | Attending: Family Medicine | Admitting: Family Medicine

## 2015-03-01 DIAGNOSIS — R1030 Lower abdominal pain, unspecified: Secondary | ICD-10-CM

## 2015-03-01 DIAGNOSIS — M25559 Pain in unspecified hip: Secondary | ICD-10-CM

## 2015-03-24 ENCOUNTER — Telehealth: Payer: Self-pay

## 2015-03-24 NOTE — Telephone Encounter (Signed)
Dr. Marin Comment what is the next step?

## 2015-03-24 NOTE — Telephone Encounter (Signed)
Patient is requesting to speak to Dr Marin Comment. Per patient her pain is getting worst on her rt side of her abdomen. Patient stated Dr Marin Comment was going to talk to Dr Garwin Brothers and let patient know what the next step would be. Patients call back number is [phone]

## 2015-03-24 NOTE — Telephone Encounter (Signed)
Spoke with patient, schedule Dr. cousins if possib She needs an earlier appointment then she will need to call me back side could schedule it for her. If her pain is intolerable then I would be happy to  arrange a CT scan.

## 2015-03-25 ENCOUNTER — Encounter: Payer: Self-pay | Admitting: Gastroenterology

## 2015-05-26 ENCOUNTER — Ambulatory Visit: Payer: BLUE CROSS/BLUE SHIELD | Admitting: Gastroenterology

## 2015-09-14 ENCOUNTER — Other Ambulatory Visit: Payer: Self-pay

## 2016-09-27 ENCOUNTER — Emergency Department (HOSPITAL_COMMUNITY)
Admission: EM | Admit: 2016-09-27 | Discharge: 2016-09-27 | Disposition: A | Discharge status: Home / Self Care | Payer: BLUE CROSS/BLUE SHIELD | Attending: Emergency Medicine | Admitting: Emergency Medicine

## 2016-09-27 ENCOUNTER — Encounter (HOSPITAL_COMMUNITY): Payer: Self-pay | Admitting: *Deleted

## 2016-09-27 DIAGNOSIS — G43809 Other migraine, not intractable, without status migrainosus: Secondary | ICD-10-CM

## 2016-09-27 DIAGNOSIS — G43909 Migraine, unspecified, not intractable, without status migrainosus: Secondary | ICD-10-CM | POA: Diagnosis present

## 2016-09-27 LAB — I-STAT CHEM 8, ED
BUN: 11 mg/dL (ref 6–20)
CHLORIDE: 112 mmol/L — AB (ref 101–111)
CREATININE: 1.1 mg/dL — AB (ref 0.44–1.00)
Calcium, Ion: 1.19 mmol/L (ref 1.15–1.40)
Glucose, Bld: 87 mg/dL (ref 65–99)
HEMATOCRIT: 41 % (ref 36.0–46.0)
HEMOGLOBIN: 13.9 g/dL (ref 12.0–15.0)
POTASSIUM: 4.4 mmol/L (ref 3.5–5.1)
Sodium: 142 mmol/L (ref 135–145)
TCO2: 20 mmol/L (ref 0–100)

## 2016-09-27 MED ORDER — SODIUM CHLORIDE 0.9 % IV BOLUS (SEPSIS)
1000.0000 mL | Freq: Once | INTRAVENOUS | Status: AC
  Administered 2016-09-27: 1000 mL via INTRAVENOUS

## 2016-09-27 MED ORDER — DIPHENHYDRAMINE HCL 50 MG/ML IJ SOLN
25.0000 mg | Freq: Once | INTRAMUSCULAR | Status: AC
Start: 2016-09-27 — End: 2016-09-27
  Administered 2016-09-27: 25 mg via INTRAVENOUS
  Filled 2016-09-27: qty 1

## 2016-09-27 MED ORDER — DEXAMETHASONE SODIUM PHOSPHATE 10 MG/ML IJ SOLN
10.0000 mg | Freq: Once | INTRAMUSCULAR | Status: AC
  Administered 2016-09-27: 10 mg via INTRAVENOUS
  Filled 2016-09-27: qty 1

## 2016-09-27 MED ORDER — KETOROLAC TROMETHAMINE 30 MG/ML IJ SOLN
30.0000 mg | Freq: Once | INTRAMUSCULAR | Status: AC
  Administered 2016-09-27: 30 mg via INTRAVENOUS
  Filled 2016-09-27: qty 1

## 2016-09-27 MED ORDER — METOCLOPRAMIDE HCL 5 MG/ML IJ SOLN
10.0000 mg | Freq: Once | INTRAMUSCULAR | Status: AC
  Administered 2016-09-27: 10 mg via INTRAVENOUS
  Filled 2016-09-27: qty 2

## 2016-09-27 NOTE — ED Notes (Signed)
Papers reviewed and patient verbalizes intent to follow up. IV removed

## 2016-09-27 NOTE — ED Provider Notes (Signed)
[facility] DEPT Provider Note   CSN: [id number] Arrival date & time: 09/27/16  [id number]     History   Chief Complaint Chief Complaint  Patient presents with  . Migraine    HPI Brandi Lewis is a 50 y.o. female who presents with a headache. Past medical history significant for migraines on daily Topamax. She states that this is typical for her migraines however has just lasted longer. The headache started 2 days ago. Constant and nothing made it better. Onset was gradual. The pain is on the right side of her head. Pain is currently 9/10. She reports associated photophobia and nausea. She denies vision changes syncope, neck pain, fever, weakness, numbness or tingling.  HPI  Past Medical History:  Diagnosis Date  . Headache(784.0)    migraine  . Thrombosed hemorrhoids     Patient Active Problem List   Diagnosis Date Noted  . Thrombosed external hemorrhoid 12/04/2011  . [id number])     Past Surgical History:  Procedure Laterality Date  . BREAST CYST EXCISION  2007   rt breast  . THYROID SURGERY  11/02/2005   thyroid exploration with isthmusectomy    OB History    No data available       Home Medications    Prior to Admission medications   Medication Sig Start Date End Date Taking? Authorizing Provider  tiZANidine (ZANAFLEX) 4 MG capsule Take 4 mg by mouth 2 (two) times daily as needed for muscle spasms.   Yes Historical Provider, MD  topiramate (TOPAMAX) 25 MG tablet Take 125 mg by mouth every evening.    Yes Historical Provider, MD  norethindrone-ethinyl estradiol (MICROGESTIN,JUNEL,LOESTRIN) 1-20 MG-MCG tablet Take 1 tablet by mouth daily.    Historical Provider, MD    Family History Family History  Problem Relation Age of Onset  . Hypertension Mother   . Cancer Father     bone, prostate  . Cancer Maternal Grandfather     colon    Social History Social History  Substance Use Topics  . Smoking status: Never Smoker  . Smokeless tobacco:  Never Used  . Alcohol use No     Allergies   Codeine   Review of Systems Review of Systems  Constitutional: Negative for fever.  Eyes: Positive for photophobia. Negative for visual disturbance.  Gastrointestinal: Positive for nausea. Negative for abdominal pain and vomiting.  Musculoskeletal: Negative for neck pain.  Neurological: Positive for headaches. Negative for dizziness, syncope, weakness, light-headedness and numbness.  All other systems reviewed and are negative.    Physical Exam Updated Vital Signs BP 118/74 (BP Location: Left Arm)   Pulse 90   Temp 98.7 F (37.1 C) (Oral)   Resp 16   Ht 5' (1.524 m)   Wt 52.2 kg   SpO2 100%   BMI 22.46 kg/m   Physical Exam  Constitutional: She is oriented to person, place, and time. She appears well-developed and well-nourished. No distress.  HENT:  Head: Normocephalic and atraumatic.  Eyes: Conjunctivae are normal. Pupils are equal, round, and reactive to light. Right eye exhibits no discharge. Left eye exhibits no discharge. No scleral icterus.  Neck: Normal range of motion.  Cardiovascular: Normal rate and regular rhythm.  Exam reveals no gallop and no friction rub.   No murmur heard. Pulmonary/Chest: Effort normal and breath sounds normal. No respiratory distress. She has no wheezes. She has no rales. She exhibits no tenderness.  Abdominal: She exhibits no distension.  Neurological: She is alert and oriented to  person, place, and time.  Mental Status:  Alert, oriented, thought content appropriate, able to give a coherent history. Speech fluent without evidence of aphasia. Able to follow 2 step commands without difficulty.  Cranial Nerves:  II:  Peripheral visual fields grossly normal, pupils equal, round, reactive to light III,IV, VI: ptosis not present, extra-ocular motions intact bilaterally  V,VII: smile symmetric, facial light touch sensation equal VIII: hearing grossly normal to voice  X: uvula elevates  symmetrically  XI: bilateral shoulder shrug symmetric and strong XII: midline tongue extension without fassiculations Motor:  Normal tone. 5/5 in upper and lower extremities bilaterally including strong and equal grip strength and dorsiflexion/plantar flexion Sensory: Pinprick and light touch normal in all extremities.  Cerebellar: normal finger-to-nose with bilateral upper extremities Gait: normal gait and balance CV: distal pulses palpable throughout    Skin: Skin is warm and dry.  Psychiatric: She has a normal mood and affect. Her behavior is normal.  Nursing note and vitals reviewed.    ED Treatments / Results  Labs (all labs ordered are listed, but only abnormal results are displayed) Labs Reviewed  I-STAT CHEM 8, ED - Abnormal; Notable for the following:       Result Value   Chloride 112 (*)    Creatinine, Ser 1.10 (*)    All other components within normal limits    EKG  EKG Interpretation None       Radiology No results found.  Procedures Procedures (including critical care time)  Medications Ordered in ED Medications  sodium chloride 0.9 % bolus 1,000 mL (0 mLs Intravenous Stopped 09/27/16 1053)  ketorolac (TORADOL) 30 MG/ML injection 30 mg (30 mg Intravenous Given 09/27/16 0929)  metoCLOPramide (REGLAN) injection 10 mg (10 mg Intravenous Given 09/27/16 0928)  diphenhydrAMINE (BENADRYL) injection 25 mg (25 mg Intravenous Given 09/27/16 0927)  dexamethasone (DECADRON) injection 10 mg (10 mg Intravenous Given 09/27/16 0930)     Initial Impression / Assessment and Plan / ED Course  I have reviewed the triage vital signs and the nursing notes.  Pertinent labs & imaging results that were available during my care of the patient were reviewed by me and considered in my medical decision making (see chart for details).  Clinical Course    50 year old female presents with headache which is typical for her migraines. Patient is afebrile, not tachycardic or  tachypneic, normotensive, and not hypoxic. Neuro exam normal. Migraine cocktail given with good relief. Patient is NAD, non-toxic, with stable VS. Patient is informed of clinical course, understands medical decision making process, and agrees with plan. Opportunity for questions provided and all questions answered. Return precautions given.  Final Clinical Impressions(s) / ED Diagnoses   Final diagnoses:  Other migraine without status migrainosus, not intractable    New Prescriptions New Prescriptions   No medications on file     Recardo Evangelist, PA-C 09/28/16 [place], MD 09/28/16 1332

## 2016-09-27 NOTE — ED Triage Notes (Signed)
Patient present with c/o migraine headache since Monday.  States she doesn't seem to be able to shake this one.  Take Topamax daily

## 2016-11-13 DIAGNOSIS — Z923 Personal history of irradiation: Secondary | ICD-10-CM

## 2016-11-13 HISTORY — PX: BREAST LUMPECTOMY: SHX2

## 2016-11-13 HISTORY — DX: Personal history of irradiation: Z92.3

## 2017-03-19 ENCOUNTER — Other Ambulatory Visit: Payer: Self-pay | Admitting: Obstetrics and Gynecology

## 2017-03-19 DIAGNOSIS — R928 Other abnormal and inconclusive findings on diagnostic imaging of breast: Secondary | ICD-10-CM

## 2017-03-23 ENCOUNTER — Ambulatory Visit
Admission: RE | Admit: 2017-03-23 | Discharge: 2017-03-23 | Disposition: A | Discharge status: Home / Self Care | Payer: BLUE CROSS/BLUE SHIELD | Source: Ambulatory Visit | Attending: Obstetrics and Gynecology | Admitting: Obstetrics and Gynecology

## 2017-03-23 ENCOUNTER — Other Ambulatory Visit: Payer: Self-pay | Admitting: Obstetrics and Gynecology

## 2017-03-23 DIAGNOSIS — R928 Other abnormal and inconclusive findings on diagnostic imaging of breast: Secondary | ICD-10-CM

## 2017-03-26 ENCOUNTER — Ambulatory Visit
Admission: RE | Admit: 2017-03-26 | Discharge: 2017-03-26 | Disposition: A | Discharge status: Home / Self Care | Payer: BLUE CROSS/BLUE SHIELD | Source: Ambulatory Visit | Attending: Obstetrics and Gynecology | Admitting: Obstetrics and Gynecology

## 2017-03-26 ENCOUNTER — Other Ambulatory Visit: Payer: Self-pay | Admitting: Obstetrics and Gynecology

## 2017-03-26 DIAGNOSIS — R928 Other abnormal and inconclusive findings on diagnostic imaging of breast: Secondary | ICD-10-CM

## 2017-03-28 ENCOUNTER — Telehealth: Payer: Self-pay | Admitting: *Deleted

## 2017-03-28 NOTE — Telephone Encounter (Signed)
Left vm for pt to return call regarding [place] for 04/04/17. Contact information provided.

## 2017-03-29 ENCOUNTER — Telehealth: Payer: Self-pay | Admitting: *Deleted

## 2017-03-29 NOTE — Telephone Encounter (Signed)
Confirmed [facility] for 04/04/17 at 815am .  Instructions and contact information given.

## 2017-03-29 NOTE — Telephone Encounter (Signed)
Mailed [facility] packet to pt. 

## 2017-04-03 ENCOUNTER — Other Ambulatory Visit: Payer: Self-pay | Admitting: *Deleted

## 2017-04-03 DIAGNOSIS — C50411 Malignant neoplasm of upper-outer quadrant of right female breast: Secondary | ICD-10-CM | POA: Insufficient documentation

## 2017-04-03 DIAGNOSIS — Z170 Estrogen receptor positive status [ER+]: Secondary | ICD-10-CM

## 2017-04-04 ENCOUNTER — Ambulatory Visit: Payer: BLUE CROSS/BLUE SHIELD | Attending: Surgery | Admitting: Physical Therapy

## 2017-04-04 ENCOUNTER — Ambulatory Visit: Payer: Self-pay | Admitting: Surgery

## 2017-04-04 ENCOUNTER — Encounter: Payer: Self-pay | Admitting: *Deleted

## 2017-04-04 ENCOUNTER — Encounter: Payer: Self-pay | Admitting: Physical Therapy

## 2017-04-04 ENCOUNTER — Encounter: Payer: Self-pay | Admitting: Hematology and Oncology

## 2017-04-04 ENCOUNTER — Ambulatory Visit
Admission: RE | Admit: 2017-04-04 | Discharge: 2017-04-04 | Disposition: A | Discharge status: Home / Self Care | Payer: BLUE CROSS/BLUE SHIELD | Source: Ambulatory Visit | Attending: Radiation Oncology | Admitting: Radiation Oncology

## 2017-04-04 ENCOUNTER — Other Ambulatory Visit (HOSPITAL_BASED_OUTPATIENT_CLINIC_OR_DEPARTMENT_OTHER): Payer: BLUE CROSS/BLUE SHIELD

## 2017-04-04 ENCOUNTER — Ambulatory Visit (HOSPITAL_BASED_OUTPATIENT_CLINIC_OR_DEPARTMENT_OTHER): Payer: BLUE CROSS/BLUE SHIELD | Admitting: Hematology and Oncology

## 2017-04-04 DIAGNOSIS — Z170 Estrogen receptor positive status [ER+]: Secondary | ICD-10-CM

## 2017-04-04 DIAGNOSIS — C50411 Malignant neoplasm of upper-outer quadrant of right female breast: Secondary | ICD-10-CM

## 2017-04-04 DIAGNOSIS — R293 Abnormal posture: Secondary | ICD-10-CM | POA: Diagnosis not present

## 2017-04-04 DIAGNOSIS — Z79899 Other long term (current) drug therapy: Secondary | ICD-10-CM | POA: Insufficient documentation

## 2017-04-04 DIAGNOSIS — Z510 Encounter for antineoplastic radiation therapy: Secondary | ICD-10-CM | POA: Insufficient documentation

## 2017-04-04 LAB — CBC WITH DIFFERENTIAL/PLATELET
BASO%: 0.5 % (ref 0.0–2.0)
Basophils Absolute: 0 10*3/uL (ref 0.0–0.1)
EOS%: 2 % (ref 0.0–7.0)
Eosinophils Absolute: 0.1 10*3/uL (ref 0.0–0.5)
HEMATOCRIT: 44.5 % (ref 34.8–46.6)
HEMOGLOBIN: 15.1 g/dL (ref 11.6–15.9)
LYMPH#: 2 10*3/uL (ref 0.9–3.3)
LYMPH%: 45.7 % (ref 14.0–49.7)
MCH: 30.3 pg (ref 25.1–34.0)
MCHC: 33.9 g/dL (ref 31.5–36.0)
MCV: 89.2 fL (ref 79.5–101.0)
MONO#: 0.4 10*3/uL (ref 0.1–0.9)
MONO%: 8.8 % (ref 0.0–14.0)
NEUT#: 1.9 10*3/uL (ref 1.5–6.5)
NEUT%: 43 % (ref 38.4–76.8)
Platelets: 227 10*3/uL (ref 145–400)
RBC: 4.99 10*6/uL (ref 3.70–5.45)
RDW: 12.9 % (ref 11.2–14.5)
WBC: 4.4 10*3/uL (ref 3.9–10.3)

## 2017-04-04 LAB — COMPREHENSIVE METABOLIC PANEL
ALT: 34 U/L (ref 0–55)
AST: 31 U/L (ref 5–34)
Albumin: 4.3 g/dL (ref 3.5–5.0)
Alkaline Phosphatase: 70 U/L (ref 40–150)
Anion Gap: 10 mEq/L (ref 3–11)
BUN: 10.2 mg/dL (ref 7.0–26.0)
CALCIUM: 9.5 mg/dL (ref 8.4–10.4)
CHLORIDE: 113 meq/L — AB (ref 98–109)
CO2: 21 mEq/L — ABNORMAL LOW (ref 22–29)
CREATININE: 1.1 mg/dL (ref 0.6–1.1)
EGFR: 69 mL/min/{1.73_m2} — ABNORMAL LOW (ref >90)
Glucose: 100 mg/dl (ref 70–140)
Potassium: 4.1 mEq/L (ref 3.5–5.1)
Sodium: 143 mEq/L (ref 136–145)
Total Bilirubin: 0.47 mg/dL (ref 0.20–1.20)
Total Protein: 7.5 g/dL (ref 6.4–8.3)

## 2017-04-04 NOTE — Therapy (Signed)
[place], [place], [address] Phone: [phone]   Fax:  [phone]  Physical Therapy Evaluation  Patient Details  Name: Brandi Lewis MRN: [id number] Date of Birth: [birth year] Referring Provider: Dr. Erroll Luna  Encounter Date: 04/04/2017      PT End of Session - 04/04/17 1252    Visit Number 1   Number of Visits 1   PT Start Time 0937   PT Stop Time 0945  Also saw pt from 1101-1114 for a total of 21 minutes   PT Time Calculation (min) 8 min   Activity Tolerance Patient tolerated treatment well   Behavior During Therapy [facility] for tasks assessed/performed      Past Medical History:  Diagnosis Date  . Headache(784.0)    migraine  . Thrombosed hemorrhoids     Past Surgical History:  Procedure Laterality Date  . BREAST BIOPSY  12/08/2005  . BREAST CYST EXCISION  2007   rt breast  . BREAST EXCISIONAL BIOPSY Right 03/30/2010  . BREAST EXCISIONAL BIOPSY Right 1984  . THYROID SURGERY  11/02/2005   thyroid exploration with isthmusectomy    There were no vitals filed for this visit.       Subjective Assessment - 04/04/17 1232    Subjective Patient reports she is here today to be seen by her medical team for her newly diagnosed right breast cancer.   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 It measures 1.4 cm and is located in the upper outer quadrant. It is ER/PR positive and HER2 negative with a Ki67 of 15%. She has no other health problems.   Patient Stated Goals Reduce lymphedema risk and learn post op shoulder ROM HEP   Currently in Pain? No/denies   Multiple Pain Sites No            [facility] PT Assessment - 04/04/17 0001      Assessment   Medical Diagnosis Right breast cancer   Referring Provider Dr. [name] [place]   Onset Date/Surgical Date 03/16/17    Hand Dominance Right   Prior Therapy none     Precautions   Precautions Other (comment)   Precaution Comments active cancer     Restrictions   Weight Bearing Restrictions No     Balance Screen   Has the patient fallen in the past 6 months No   Has the patient had a decrease in activity level because of a fear of falling?  No   Is the patient reluctant to leave their home because of a fear of falling?  No     Home Social worker Private residence   Living Arrangements Spouse/significant other   Available Help at Discharge Family     Prior Function   Level of Independence Independent   Vocation Full time employment   Press photographer work / billing at [employer] She has not recently been exercising but previously did Zumba twice a week     Cognition   Overall Cognitive Status Within Functional Limits for tasks assessed     Posture/Postural Control   Posture/Postural Control Postural limitations   Postural Limitations Forward head;Rounded Shoulders     ROM / Strength   AROM / PROM / Strength AROM;Strength     AROM   AROM Assessment Site Shoulder;Cervical   Right/Left Shoulder Right;Left  Right Shoulder Extension 49 Degrees   Right Shoulder Flexion 141 Degrees   Right Shoulder ABduction 165 Degrees   Right Shoulder Internal Rotation 63 Degrees   Right Shoulder External Rotation 84 Degrees   Left Shoulder Extension 39 Degrees   Left Shoulder Flexion 135 Degrees   Left Shoulder ABduction 157 Degrees   Left Shoulder Internal Rotation 55 Degrees   Left Shoulder External Rotation 80 Degrees   Cervical Flexion WNL   Cervical Extension WNL   Cervical - Right Side Bend WNL   Cervical - Left Side Bend WNL   Cervical - Right Rotation WNL   Cervical - Left Rotation WNL     Strength   Overall Strength Within functional limits for tasks performed           LYMPHEDEMA/ONCOLOGY QUESTIONNAIRE - 04/04/17 1248       Type   Cancer Type Right breast cancer     Lymphedema Assessments   Lymphedema Assessments Upper extremities     Right Upper Extremity Lymphedema   10 cm Proximal to Olecranon Process 24.8 cm   Olecranon Process 21.2 cm   10 cm Proximal to Ulnar Styloid Process 21 cm   Just Proximal to Ulnar Styloid Process 12.9 cm   Across Hand at [employer] 16.5 cm   At [place] of 2nd Digit 5.5 cm     Left Upper Extremity Lymphedema   10 cm Proximal to Olecranon Process 25.2 cm   Olecranon Process 21.5 cm   10 cm Proximal to Ulnar Styloid Process 19.9 cm   Just Proximal to Ulnar Styloid Process 12.8 cm   Across Hand at [employer] 16.5 cm   At [place] of 2nd Digit 5.2 cm       Patient was instructed today in a home exercise program today for post op shoulder range of motion. These included active assist shoulder flexion in sitting, scapular retraction, wall walking with shoulder abduction, and hands behind head external rotation.  She was encouraged to do these twice a day, holding 3 seconds and repeating 5 times when permitted by her physician.           PT Education - 04/04/17 1251    Education provided Yes   Education Details Lymphedema risk reduction and post op shoulder ROM HEP   Person(s) Educated Patient;Spouse   Methods Explanation;Demonstration;Handout   Comprehension Returned demonstration;Verbalized understanding              Breast Clinic Goals - 04/04/17 1313      Patient will be able to verbalize understanding of pertinent lymphedema risk reduction practices relevant to her diagnosis specifically related to skin care.   Time 1   Period Days   Status Achieved     Patient will be able to return demonstrate and/or verbalize understanding of the post-op home exercise program related to regaining shoulder range of motion.   Time 1   Period Days   Status Achieved     Patient will be able to verbalize understanding of the importance of attending the  postoperative After Breast Cancer Class for further lymphedema risk reduction education and therapeutic exercise.   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 It measures 1.4 cm and is located in the  upper outer quadrant. It is ER/PR positive and HER2 negative with a Ki67 of 15%. She has no other health problems. Her multidisciplinary medical team met prior to her assessments to determine a recommended treatment plan. She is planning to have a right lumpectomy and sentinel node biopsy followed by Oncotype testing, radiation and anti-estrogen therapy. She may benefit from post op PT to regain shoulder ROM and reduce lymphedema risk. Due to her lack of comorbidities, her eval is of low complexity.   Rehab Potential Excellent   Clinical Impairments Affecting Rehab Potential None   PT Frequency One time visit   PT Treatment/Interventions Therapeutic exercise;Patient/family education   PT Next Visit Plan Will f/u after surgery to determine PT needs   PT Home Exercise Plan Post op shoulder ROM HEP   Consulted and Agree with Plan of Care Patient;Family member/caregiver   Family Member Consulted Husband      Patient will benefit from skilled therapeutic intervention in order to improve the following deficits and impairments:  Decreased range of motion, Impaired UE functional use, Pain, Decreased knowledge of precautions, Postural dysfunction  Visit Diagnosis: Abnormal posture - Plan: PT plan of care cert/re-cert  Carcinoma of upper-outer quadrant of right breast in female, estrogen receptor positive ([place]) - Plan: PT plan of care cert/re-cert   Patient will follow up at outpatient cancer rehab if needed following surgery.  If the patient requires physical therapy at that time, a specific plan will be dictated and sent to the  referring physician for approval. The patient was educated today on appropriate basic range of motion exercises to begin post operatively and the importance of attending the After Breast Cancer class following surgery.  Patient was educated today on lymphedema risk reduction practices as it pertains to recommendations that will benefit the patient immediately following surgery.  She verbalized good understanding.  No additional physical therapy is indicated at this time.      Problem List Patient Active Problem List   Diagnosis Date Noted  . Malignant neoplasm of upper-outer quadrant of right breast in female, estrogen receptor positive ([place]) 04/03/2017  . Thrombosed external hemorrhoid 12/04/2011  . FMMCRFVO(360.6Annia Friendly, PT 04/04/17 1:15 PM  [place], [place], [address] Phone: [phone]   Fax:  [phone]  Name: MELINNA LINAREZ MRN: [id number] Date of Birth: 1966/06/05

## 2017-04-04 NOTE — Progress Notes (Signed)
[facility] CONSULT NOTE  Patient Care Team: Patient, No Pcp Per as PCP - General (General Practice) Servando Salina, MD as Consulting Physician (Obstetrics and Gynecology) Erroll Luna, MD as Consulting Physician (General Surgery) Nicholas Lose, MD as Consulting Physician (Hematology and Oncology) Kyung Rudd, MD as Consulting Physician (Radiation Oncology) Servando Salina, MD as Consulting Physician (Obstetrics and Gynecology)  CHIEF COMPLAINTS/PURPOSE OF CONSULTATION:  Newly diagnosed breast cancer  HISTORY OF PRESENTING ILLNESS:  Brandi Lewis 51 y.o. female is here because of recent diagnosis of right breast cancer. Patient had an excisional biopsy in the right breast in 2007. She had a screening mammogram that detected a right breast density by ultrasound measured 1.4 cm. Axilla is negative. Biopsy revealed that this was a grade 2 invasive ductal carcinoma with DCIS that is ER/PR positive and HER-2 negative with a Ki-67 of 15%. She was presented this morning in the multidisciplinary tumor board and she is here today to discuss the treatment plan. She is in some discomfort from the recent biopsy but otherwise doing quite well.  I reviewed her records extensively and collaborated the history with the patient.  SUMMARY OF ONCOLOGIC HISTORY:   Malignant neoplasm of upper-outer quadrant of right breast in female, estrogen receptor positive ([place])   03/26/2017 Initial Diagnosis    Screening detected right breast breast density 1.4 cm, axilla negative, biopsy: Grade 2 IDC with DCIS ER 90%, PR 90%, HER-2 negative ratio 1.05, Ki-67 15%, T1c N0 stage IA clinical stage      MEDICAL HISTORY:  Past Medical History:  Diagnosis Date  . Headache(784.0)    migraine  . Thrombosed hemorrhoids     SURGICAL HISTORY: Past Surgical History:  Procedure Laterality Date  . BREAST BIOPSY  12/08/2005  . BREAST CYST EXCISION  2007   rt breast  . BREAST EXCISIONAL BIOPSY  Right 03/30/2010  . BREAST EXCISIONAL BIOPSY Right 1984  . THYROID SURGERY  11/02/2005   thyroid exploration with isthmusectomy    SOCIAL HISTORY: Social History   Social History  . Marital status: Married    Spouse name: N/A  . Number of children: N/A  . Years of education: N/A   Occupational History  . Not on file.   Social History Main Topics  . Smoking status: Never Smoker  . Smokeless tobacco: Never Used  . Alcohol use No  . Drug use: No  . Sexual activity: Not on file   Other Topics Concern  . Not on file   Social History Narrative  . No narrative on file    FAMILY HISTORY: Family History  Problem Relation Age of Onset  . Hypertension Mother   . Cancer Father        bone, prostate  . Cancer Maternal Grandfather        colon    ALLERGIES:  is allergic to codeine.  MEDICATIONS:  Current Outpatient Prescriptions  Medication Sig Dispense Refill  . topiramate (TOPAMAX) 25 MG tablet Take 125 mg by mouth every evening.     . norethindrone-ethinyl estradiol (MICROGESTIN,JUNEL,LOESTRIN) 1-20 MG-MCG tablet Take 1 tablet by mouth daily.    Marland Kitchen tiZANidine (ZANAFLEX) 4 MG capsule Take 4 mg by mouth 2 (two) times daily as needed for muscle spasms.     No current facility-administered medications for this visit.     REVIEW OF SYSTEMS:   Constitutional: Denies fevers, chills or abnormal night sweats Eyes: Denies blurriness of vision, double vision or watery eyes Ears, nose, mouth, throat, and  face: Denies mucositis or sore throat Respiratory: Denies cough, dyspnea or wheezes Cardiovascular: Denies palpitation, chest discomfort or lower extremity swelling Gastrointestinal:  Denies nausea, heartburn or change in bowel habits Skin: Denies abnormal skin rashes Lymphatics: Denies new lymphadenopathy or easy bruising Neurological:Denies numbness, tingling or new weaknesses Behavioral/Psych: Mood is stable, no new changes  Breast:  Denies any palpable lumps or discharge,  Biopsy site discomfort All other systems were reviewed with the patient and are negative.  PHYSICAL EXAMINATION: ECOG PERFORMANCE STATUS: 1 - Symptomatic but completely ambulatory  Vitals:   04/04/17 0912  BP: (!) 144/82  Pulse: 75  Resp: 18  Temp: 97.8 F (36.6 C)   Filed Weights   04/04/17 0912  Weight: 111 lb 3.2 oz (50.4 kg)    GENERAL:alert, no distress and comfortable SKIN: skin color, texture, turgor are normal, no rashes or significant lesions EYES: normal, conjunctiva are pink and non-injected, sclera clear OROPHARYNX:no exudate, no erythema and lips, buccal mucosa, and tongue normal  NECK: supple, thyroid normal size, non-tender, without nodularity LYMPH:  no palpable lymphadenopathy in the cervical, axillary or inguinal LUNGS: clear to auscultation and percussion with normal breathing effort HEART: regular rate & rhythm and no murmurs and no lower extremity edema ABDOMEN:abdomen soft, non-tender and normal bowel sounds Musculoskeletal:no cyanosis of digits and no clubbing  PSYCH: alert & oriented x 3 with fluent speech NEURO: no focal motor/sensory deficits BREAST: Postbiopsy changes right breast. No palpable axillary or supraclavicular lymphadenopathy (exam performed in the presence of a chaperone)   LABORATORY DATA:  I have reviewed the data as listed Lab Results  Component Value Date   WBC 4.4 04/04/2017   HGB 15.1 04/04/2017   HCT 44.5 04/04/2017   MCV 89.2 04/04/2017   PLT 227 04/04/2017   Lab Results  Component Value Date   NA 143 04/04/2017   K 4.1 04/04/2017   CL 112 (H) 09/27/2016   CO2 21 (L) 04/04/2017    RADIOGRAPHIC STUDIES: I have personally reviewed the radiological reports and agreed with the findings in the report.  ASSESSMENT AND PLAN:  Malignant neoplasm of upper-outer quadrant of right breast in female, estrogen receptor positive ([place]) 03/26/2017 Screening detected right breast breast density 1.4 cm, axilla negative, biopsy: Grade  2 IDC with DCIS ER 90%, PR 90%, HER-2 negative ratio 1.05, Ki-67 15%, T1c N0 stage IA clinical stage  Pathology and radiology counseling:Discussed with the patient, the details of pathology including the type of breast cancer,the clinical staging, the significance of ER, PR and HER-2/neu receptors and the implications for treatment. After reviewing the pathology in detail, we proceeded to discuss the different treatment options between surgery, radiation, chemotherapy, antiestrogen therapies.  Recommendations: 1. Breast conserving surgery followed by 2. Oncotype DX testing to determine if chemotherapy would be of any benefit followed by 3. Adjuvant radiation therapy followed by 4. Adjuvant antiestrogen therapy  Oncotype counseling: I discussed Oncotype DX test. I explained to the patient that this is a 21 gene panel to evaluate patient tumors DNA to calculate recurrence score. This would help determine whether patient has high risk or intermediate risk or low risk breast cancer. She understands that if her tumor was found to be high risk, she would benefit from systemic chemotherapy. If low risk, no need of chemotherapy. If she was found to be intermediate risk, we would need to evaluate the score as well as other risk factors and determine if an abbreviated chemotherapy may be of benefit.  Return to clinic after  surgery to discuss final pathology report and then determine if Oncotype DX testing will need to be sent.   All questions were answered. The patient knows to call the clinic with any problems, questions or concerns.    Rulon Eisenmenger, MD 04/04/17

## 2017-04-04 NOTE — Progress Notes (Signed)
Nutrition Assessment  Reason for Assessment:  Pt seen in Breast Clinic  ASSESSMENT:  51 year old female with right breast cancer.  Past medical history reviewed.   Medications:  reviewed  Labs: reviewed  Anthropometrics:   Height: 60 inches Weight: 111 lb BMI: 21.8   NUTRITION DIAGNOSIS: Food and nutrition related knowledge deficit related to new diagnosis of breast cancer as evidenced by no prior need for nutrition related information.  INTERVENTION:   Discussed and provided packet of information regarding nutritional tips for breast cancer patients.  Questions answered.  Teachback method used.  Contact information provided and patient knows to contact me with questions/concerns.    MONITORING, EVALUATION, and GOAL: Pt will consume a healthy plant based diet to maintain lean body mass throughout treatment.   Brandi Lewis, [place], [place] Registered Dietitian [phone] (pager)

## 2017-04-04 NOTE — Progress Notes (Signed)
Clinical Social Work [place] Psychosocial Distress Screening [facility]  Patient completed distress screening protocol and scored a 2 on the Psychosocial Distress Thermometer which indicates mild distress. Clinical Social Worker met with patient and patients family in [facility] to assess for distress and other psychosocial needs. Patient stated she was feeling overwhelmed but felt "better" after meeting with the treatment team and getting more information on her treatment plan. CSW and patient discussed common feeling and emotions when being diagnosed with cancer, and the importance of support during treatment. CSW informed patient of the support team and support services at [facility]. CSW provided contact information and encouraged patient to call with any questions or concerns.  ONCBCN DISTRESS SCREENING 04/04/2017  Screening Type Initial Screening  Distress experienced in past week (1-10) 2  Information Concerns Type Lack of info about diagnosis  Physical Problem type Sleep/insomnia  Physician notified of physical symptoms Yes     Johnnye Lana, MSW, LCSW, OSW-C Clinical Social Worker [place] [phone]

## 2017-04-04 NOTE — Progress Notes (Signed)
Radiation Oncology         [phone] ________________________________  Name: Brandi Lewis MRN: [id number]  Date: 04/04/2017  DOB: Jan 13, 1966  KZ:[id number], No Pcp Per  Erroll Luna, MD     REFERRING PHYSICIAN: Erroll Luna, MD   DIAGNOSIS: The encounter diagnosis was Malignant neoplasm of upper-outer quadrant of right breast in female, estrogen receptor positive ([place]).   HISTORY OF PRESENT ILLNESS:  Brandi Lewis is a 51 y.o. female seen in multidisciplinary breast clinic for a newly diagnosed right breast cancer. Patient has a history of CSL found on a previous right breast biopsy in 2007. Screening mammography on 03/16/17 revealed an asymmetrical nodular density in the upper right breast approximately 4cm from the nipple. Ultrasound revealed an irregular hypoechoic mass in the right breast at the 11 o'clock axis, 2 cm from the nipple, with internal vascularity, measuring 1.4 x 0.7 x 0.7 cm. The axilla was negative. Biopsy on 03/26/17 showed invasive ductal carcinoma and ductal carcinoma in situ, grade II. Hormone receptor status is ER+ (90%), PR+ (90%), HER-2 (neg) and Ki67 (15%). She comes today to review the treatment options and recommendations for her newly diagnosed breast cancer.   PREVIOUS RADIATION THERAPY: No   PAST MEDICAL HISTORY:  Past Medical History:  Diagnosis Date  . Headache(784.0)    migraine  . Thrombosed hemorrhoids        PAST SURGICAL HISTORY: Past Surgical History:  Procedure Laterality Date  . BREAST BIOPSY  12/08/2005  . BREAST CYST EXCISION  2007   rt breast  . BREAST EXCISIONAL BIOPSY Right 03/30/2010  . BREAST EXCISIONAL BIOPSY Right 1984  . THYROID SURGERY  11/02/2005   thyroid exploration with isthmusectomy     FAMILY HISTORY:  Family History  Problem Relation Age of Onset  . Hypertension Mother   . Cancer Father        bone, prostate  . Cancer Maternal Grandfather        colon     SOCIAL HISTORY:  reports that she  has never smoked. She has never used smokeless tobacco. She reports that she does not drink alcohol or use drugs.  She is married and lives in [place] with her husband. She works full-time as a Research scientist (physical sciences), M-F 8am-5pm but reports that her employer is very flexible.   ALLERGIES: Codeine   MEDICATIONS:  Current Outpatient Prescriptions  Medication Sig Dispense Refill  . norethindrone-ethinyl estradiol (MICROGESTIN,JUNEL,LOESTRIN) 1-20 MG-MCG tablet Take 1 tablet by mouth daily.    Marland Kitchen tiZANidine (ZANAFLEX) 4 MG capsule Take 4 mg by mouth 2 (two) times daily as needed for muscle spasms.    Marland Kitchen topiramate (TOPAMAX) 25 MG tablet Take 125 mg by mouth every evening.      No current facility-administered medications for this encounter.      REVIEW OF SYSTEMS: On review of systems, the patient reports that she is doing well overall. She reports wearing glasses. She denies any chest pain, shortness of breath, cough, fevers, chills, night sweats, unintended weight changes. She denies any bowel or bladder disturbances, and denies abdominal pain, nausea or vomiting. She denies any new musculoskeletal or joint aches or pains. A complete review of systems is obtained and is otherwise negative.  PHYSICAL EXAM:  Wt Readings from Last 3 Encounters:  04/04/17 111 lb 3.2 oz (50.4 kg)  09/27/16 115 lb (52.2 kg)  01/31/15 116 lb (52.6 kg)   Temp Readings from Last 3 Encounters:  04/04/17 97.8 F (36.6 C) (Oral)  09/27/16  98.7 F (37.1 C) (Oral)  01/31/15 98.3 F (36.8 C) (Oral)   BP Readings from Last 3 Encounters:  04/04/17 (!) 144/82  09/27/16 124/73  01/31/15 118/84   Pulse Readings from Last 3 Encounters:  04/04/17 75  09/27/16 97  01/31/15 91    In general this is a well appearing african [place] female in no acute distress. She is alert and oriented x4 and appropriate throughout the examination. HEENT reveals that the patient is normocephalic, atraumatic. EOMs are intact. PERRLA. Skin  is intact without any evidence of gross lesions. Cardiovascular exam reveals a regular rate and rhythm, no clicks rubs or murmurs are auscultated. Chest is clear to auscultation bilaterally. Lymphatic assessment is performed and does not reveal any adenopathy in the cervical, supraclavicular, axillary, or inguinal chains. Abdomen has active bowel sounds in all quadrants and is intact. The abdomen is soft, non tender, non distended. Lower extremities are negative for pretibial pitting edema, deep calf tenderness, cyanosis or clubbing. Breast exam reveals no tenderness to palpation and no palpable mass bilaterally.  Biopsy site on the right breast is well healed without signs of infection and no drainage.  There is no nipple discharge or bleeding bilaterally.  ECOG = 0  0 - Asymptomatic (Fully active, able to carry on all predisease activities without restriction)  1 - Symptomatic but completely ambulatory (Restricted in physically strenuous activity but ambulatory and able to carry out work of a light or sedentary nature. For example, light housework, office work)  2 - Symptomatic, <50% in bed during the day (Ambulatory and capable of all self care but unable to carry out any work activities. Up and about more than 50% of waking hours)  3 - Symptomatic, >50% in bed, but not bedbound (Capable of only limited self-care, confined to bed or chair 50% or more of waking hours)  4 - Bedbound (Completely disabled. Cannot carry on any self-care. Totally confined to bed or chair)  5 - Death   Eustace Pen MM, Creech RH, Tormey DC, et al. [phone]). "Toxicity and response criteria of the [facility]". [place] Oncol. 5 (6): 649-55    LABORATORY DATA:  Lab Results  Component Value Date   WBC 4.4 04/04/2017   HGB 15.1 04/04/2017   HCT 44.5 04/04/2017   MCV 89.2 04/04/2017   PLT 227 04/04/2017   Lab Results  Component Value Date   NA 143 04/04/2017   K 4.1 04/04/2017   CL 112 (H)  09/27/2016   CO2 21 (L) 04/04/2017   Lab Results  Component Value Date   ALT 34 04/04/2017   AST 31 04/04/2017   ALKPHOS 70 04/04/2017   BILITOT 0.47 04/04/2017      RADIOGRAPHY: US Breast Ltd Uni Right Inc Axilla  Addendum Date: 03/26/2017   ADDENDUM REPORT: 03/26/2017 16:46 ADDENDUM: The right axilla was scanned at the time of biopsy on 03/26/2017. There is no evidence of right axillary lymphadenopathy. Electronically Signed   By: Ammie Ferrier M.D.   On: 03/26/2017 16:46   Result Date: 03/26/2017 CLINICAL DATA:  Patient returns today to evaluate a possible right breast asymmetry questioned on recent screening mammogram. History of benign right breast excisional biopsies x2. EXAM: 2D DIGITAL DIAGNOSTIC RIGHT MAMMOGRAM WITH CAD AND ADJUNCT TOMO ULTRASOUND RIGHT BREAST COMPARISON:  Previous exams including recent screening mammogram dated 03/16/2017. ACR Breast Density Category c: The breast tissue is heterogeneously dense, which may obscure small masses. FINDINGS: On today's additional views with spot compression  and 3D tomosynthesis, there is a persistent asymmetry within the upper right breast, with surrounding distortion compatible with patient's history of previous surgical excision. Mammographic images were processed with CAD. Targeted ultrasound is performed, showing an irregular hypoechoic mass in the right breast at the 11 o'clock axis, 2 cm from the nipple, with internal vascularity, measuring 1.4 x 0.7 x 0.7 cm, a possible correlate for the mammographic finding. IMPRESSION: Irregular mass in the right breast at the 11 o'clock axis, 2 cm from the nipple, measuring 1.4 x 0.7 x 0.7 cm, a possible correlate for the mammographic asymmetry. Ultrasound-guided biopsy is recommended RECOMMENDATION: Ultrasound-guided biopsy of the right breast mass at the 11 o'clock axis. Ultrasound-guided biopsy is scheduled for May 14th at 3:45 p.m. I have discussed the findings and recommendations with the  patient. Results were also provided in writing at the conclusion of the visit. If applicable, a reminder letter will be sent to the patient regarding the next appointment. BI-RADS CATEGORY  4: Suspicious. Electronically Signed: By: Franki Cabot M.D. On: 03/23/2017 09:05   Mm Diag Breast Tomo Uni Right  Addendum Date: 03/26/2017   ADDENDUM REPORT: 03/26/2017 16:46 ADDENDUM: The right axilla was scanned at the time of biopsy on 03/26/2017. There is no evidence of right axillary lymphadenopathy. Electronically Signed   By: Ammie Ferrier M.D.   On: 03/26/2017 16:46   Result Date: 03/26/2017 CLINICAL DATA:  Patient returns today to evaluate a possible right breast asymmetry questioned on recent screening mammogram. History of benign right breast excisional biopsies x2. EXAM: 2D DIGITAL DIAGNOSTIC RIGHT MAMMOGRAM WITH CAD AND ADJUNCT TOMO ULTRASOUND RIGHT BREAST COMPARISON:  Previous exams including recent screening mammogram dated 03/16/2017. ACR Breast Density Category c: The breast tissue is heterogeneously dense, which may obscure small masses. FINDINGS: On today's additional views with spot compression and 3D tomosynthesis, there is a persistent asymmetry within the upper right breast, with surrounding distortion compatible with patient's history of previous surgical excision. Mammographic images were processed with CAD. Targeted ultrasound is performed, showing an irregular hypoechoic mass in the right breast at the 11 o'clock axis, 2 cm from the nipple, with internal vascularity, measuring 1.4 x 0.7 x 0.7 cm, a possible correlate for the mammographic finding. IMPRESSION: Irregular mass in the right breast at the 11 o'clock axis, 2 cm from the nipple, measuring 1.4 x 0.7 x 0.7 cm, a possible correlate for the mammographic asymmetry. Ultrasound-guided biopsy is recommended RECOMMENDATION: Ultrasound-guided biopsy of the right breast mass at the 11 o'clock axis. Ultrasound-guided biopsy is scheduled for May  14th at 3:45 p.m. I have discussed the findings and recommendations with the patient. Results were also provided in writing at the conclusion of the visit. If applicable, a reminder letter will be sent to the patient regarding the next appointment. BI-RADS CATEGORY  4: Suspicious. Electronically Signed: By: Franki Cabot M.D. On: 03/23/2017 09:05   Mm Clip Placement Right  Result Date: 03/26/2017 CLINICAL DATA:  Post biopsy mammogram of the right breast for clip placement. EXAM: DIAGNOSTIC RIGHT MAMMOGRAM POST ULTRASOUND BIOPSY COMPARISON:  Previous exam(s). FINDINGS: Mammographic images were obtained following ultrasound guided biopsy of a right breast mass at 11 o'clock. The ribbon shaped biopsy marking clip is appropriately positioned at the site of biopsy at 11 o'clock in the right breast. IMPRESSION: The ribbon shaped biopsy marking clip is appropriately positioned at the site of biopsy at 11 o'clock in the right breast. Final Assessment: Post Procedure Mammograms for Marker Placement Electronically Signed  By: Ammie Ferrier M.D.   On: 03/26/2017 16:44   [place] Rt Breast Bx W Loc Dev 1st Lesion Img Bx Spec US Guide  Addendum Date: 03/28/2017   ADDENDUM REPORT: 03/28/2017 08:15 ADDENDUM: Pathology revealed grade II invasive ductal carcinoma and ductal carcinoma in situ in the right breast. This was found to be concordant by Dr. Ammie Ferrier. Pathology results were discussed with the patient by telephone. The patient reported doing well after the biopsy with tenderness at the site. Post biopsy instructions and care were reviewed and questions were answered. The patient was encouraged to call [facility] for any additional concerns. The patient was referred to the [facility] on Apr 04, 2017. Pathology results reported by Susa Raring RN, BSN on 03/28/2017. Electronically Signed   By: Ammie Ferrier M.D.    On: 03/28/2017 08:15   Result Date: 03/28/2017 CLINICAL DATA:  51 year old female presenting for ultrasound-guided biopsy of the right breast mass. EXAM: ULTRASOUND GUIDED RIGHT BREAST CORE NEEDLE BIOPSY COMPARISON:  Previous exam(s). FINDINGS: Prior to proceeding with the biopsy, the right axilla was imaged to evaluate lymph nodes. Multiple normal-appearing lymph nodes are identified. I met with the patient and we discussed the procedure of ultrasound-guided biopsy, including benefits and alternatives. We discussed the high likelihood of a successful procedure. We discussed the risks of the procedure, including infection, bleeding, tissue injury, clip migration, and inadequate sampling. Informed written consent was given. The usual time-out protocol was performed immediately prior to the procedure. Lesion quadrant: Upper-outer Using sterile technique and 1% Lidocaine as local anesthetic, under direct ultrasound visualization, a 14 gauge spring-loaded device was used to perform biopsy of right breast mass at 11 o'clock using a medial approach. At the conclusion of the procedure a ribbon shaped tissue marker clip was deployed into the biopsy cavity. Follow up 2 view mammogram was performed and dictated separately. IMPRESSION: 1. Ultrasound guided biopsy of right breast mass 11 o'clock. No apparent complications. 2. There is no evidence of right axillary lymphadenopathy on diagnostic ultrasound. Electronically Signed: By: Ammie Ferrier M.D. On: 03/26/2017 16:40       IMPRESSION/PLAN: 1. 51 y.o. female with Stage 1A, cT1cN0Mx, grade II, ER+/PR+/Her2 negative invasive ductal carcinoma of the right breast. Dr. Lisbeth Renshaw discusses the pathology findings and reviews the nature of invasive disease. The consensus from the breast conference include breast conservation with lumpectomy. Her tumor will be sent for Oncotype DX testing to determine the role for chemotherapy. Provided that chemotherapy is not indicated, the  patient's course would then be followed by external radiotherapy to the breast followed by antiestrogen therapy. If chemotherapy is indicated, we would follow chemotherapy. We discussed the risks, benefits, short, and long term effects of radiotherapy, and the patient is interested in proceeding. Dr. Lisbeth Renshaw discusses the delivery and logistics of radiotherapy and reviews his recommendation for a course of 6-1/2 weeks of radiotherapy. We will see her back approximately 2 weeks after surgery to move forward with the simulation and planning process and anticipate starting radiotherapy about 4 weeks after surgery.   The above documentation reflects my direct findings during this shared patient visit. Please see separate note by Dr. Lisbeth Renshaw on this date for the remainder of the patient's plan of care.    Nicholos Johns, PA-C  This document serves as a record of services personally performed by Kyung Rudd, MD and Freeman Caldron, PA-C. It was created on their  behalf by Bethann Humble, a trained medical scribe. The creation of this record is based on the scribe's personal observations and the provider's statements to them. This document has been checked and approved by the attending provider.

## 2017-04-04 NOTE — Assessment & Plan Note (Signed)
03/26/2017 Screening detected right breast breast density 1.4 cm, axilla negative, biopsy: Grade 2 IDC with DCIS ER 90%, PR 90%, HER-2 negative ratio 1.05, Ki-67 15%, T1c N0 stage IA clinical stage  Pathology and radiology counseling:Discussed with the patient, the details of pathology including the type of breast cancer,the clinical staging, the significance of ER, PR and HER-2/neu receptors and the implications for treatment. After reviewing the pathology in detail, we proceeded to discuss the different treatment options between surgery, radiation, chemotherapy, antiestrogen therapies.  Recommendations: 1. Breast conserving surgery followed by 2. Oncotype DX testing to determine if chemotherapy would be of any benefit followed by 3. Adjuvant radiation therapy followed by 4. Adjuvant antiestrogen therapy  Oncotype counseling: I discussed Oncotype DX test. I explained to the patient that this is a 21 gene panel to evaluate patient tumors DNA to calculate recurrence score. This would help determine whether patient has high risk or intermediate risk or low risk breast cancer. She understands that if her tumor was found to be high risk, she would benefit from systemic chemotherapy. If low risk, no need of chemotherapy. If she was found to be intermediate risk, we would need to evaluate the score as well as other risk factors and determine if an abbreviated chemotherapy may be of benefit.  Return to clinic after surgery to discuss final pathology report and then determine if Oncotype DX testing will need to be sent.

## 2017-04-04 NOTE — H&P (Signed)
Brandi Lewis 04/04/2017 7:27 AM Location: [facility] Patient #: [id number] DOB: [birth year] Married / Language: English / Race: Black or African American Female  History of Present Illness Brandi Moores [name] MD; 04/04/2017 10:44 AM) Patient words: patient said at the request of Dr. cousins after abnormal mammogram. The patient went for her annual screening mammogram. 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 patient denies history of breast pain, breast mass or nipple discharge. There is no family history of breast cancer.         ADDENDUM REPORT: 03/26/2017 16:46 ADDENDUM: The right axilla was scanned at the time of biopsy on 03/26/2017. There is no evidence of right axillary lymphadenopathy. Electronically Signed By: Ammie Ferrier M.D. On: 03/26/2017 16:46 Addended by Jamie Kato, MD on 03/26/2017 4:48 PM  Study Result CLINICAL DATA: Patient returns today to evaluate a possible right breast asymmetry questioned on recent screening mammogram. History of benign right breast excisional biopsies x2. EXAM: 2D DIGITAL DIAGNOSTIC RIGHT MAMMOGRAM WITH CAD AND ADJUNCT TOMO ULTRASOUND RIGHT BREAST COMPARISON: Previous exams including recent screening mammogram dated 03/16/2017. ACR Breast Density Category c: The breast tissue is heterogeneously dense, which may obscure small masses. FINDINGS: On today's additional views with spot compression and 3D tomosynthesis, there is a persistent asymmetry within the upper right breast, with surrounding distortion compatible with patient's history of previous surgical excision. Mammographic images were processed with CAD. Targeted ultrasound is performed, showing an irregular hypoechoic mass in the right breast at the 11 o'clock axis, 2 cm  from the nipple, with internal vascularity, measuring 1.4 x 0.7 x 0.7 cm, a possible correlate for the mammographic finding. IMPRESSION: Irregular mass in the right breast at the 11 o'clock axis, 2 cm from the nipple, measuring 1.4 x 0.7 x 0.7 cm, a possible correlate for the mammographic asymmetry. Ultrasound-guided biopsy is recommended RECOMMENDATION: Ultrasound-guided biopsy of the right breast mass at the 11 o'clock axis. Ultrasound-guided biopsy is scheduled for May 14th at 3:45 p.m. I have discussed the findings and recommendations with the patient. Results were also provided in writing at the conclusion of the visit. If applicable, a reminder letter will be sent to the patient regarding the next appointment. BI-RADS CATEGORY 4: Suspicious. Electronically Signed: By: Franki Cabot M.D. On: 03/23/2017 09:05             ADDITIONAL INFORMATION: PROGNOSTIC INDICATORS Results: IMMUNOHISTOCHEMICAL AND MORPHOMETRIC ANALYSIS PERFORMED MANUALLY Estrogen Receptor: 90%, POSITIVE, STRONG STAINING INTENSITY Progesterone Receptor: 90%, POSITIVE, STRONG STAINING INTENSITY Proliferation Marker Ki67: 15% REFERENCE RANGE ESTROGEN RECEPTOR NEGATIVE 0% POSITIVE =>1% REFERENCE RANGE PROGESTERONE RECEPTOR NEGATIVE 0% POSITIVE =>1% All controls stained appropriately Claudette Laws MD Pathologist, Electronic Signature ( Signed 03/30/2017) FLUORESCENCE IN-SITU HYBRIDIZATION Results: HER2 - NEGATIVE RATIO OF HER2/CEP17 SIGNALS 1.05 AVERAGE HER2 COPY NUMBER PER CELL 1.15 Reference Range: NEGATIVE HER2/CEP17 Ratio <2.0 and average HER2 copy number <4.0 EQUIVOCAL HER2/CEP17 Ratio <2.0 and average HER2 copy number >=4.0 and <6.0 1 of 3 FINAL for MANDA, [place] [phone]) ADDITIONAL INFORMATION:(continued) POSITIVE HER2/CEP17 Ratio >=2.0 or <2.0 and average HER2 copy number >=6.0 Enid Cutter MD Pathologist, Electronic Signature ( Signed 03/29/2017) The malignant cells are  positive for E-Cadherin, supporting a ductal phenotype. (JBK:kh 03/27/17) Enid Cutter MD Pathologist, Electronic Signature.  The patient is a 51 year old female.   Past Surgical History Brandi Slipper, RN; 04/04/2017 7:27  AM) Breast Biopsy Bilateral. multiple Thyroid Surgery  Diagnostic Studies History Brandi Slipper, RN; 04/04/2017 7:27 AM) Colonoscopy 1-5 years ago Mammogram within last year 1-3 years ago Pap Smear 1-5 years ago  Medication History Brandi Slipper, RN; 04/04/2017 7:27 AM) Medications Reconciled  Social History Brandi Slipper, RN; 04/04/2017 7:27 AM) Caffeine use Carbonated beverages, Coffee, Tea. No alcohol use No drug use Tobacco use Never smoker.  Family History Brandi Slipper, RN; 04/04/2017 7:27 AM) Arthritis Sister. Hypertension Mother, Sister. Migraine Headache Family Members In General, Mother, Sister. Prostate Cancer Family Members In General, Father.  Pregnancy / Birth History Brandi Slipper, RN; 04/04/2017 7:27 AM) Age at menarche 24 years, 39 years. Contraceptive History Oral contraceptives. Gravida 1 Maternal age 5-30 Para 10  Other Problems Brandi Slipper, RN; 04/04/2017 7:27 AM) Hemorrhoids Lump In Breast Migraine Headache Thyroid Disease     Review of Systems Brandi Slipper RN; 04/04/2017 7:27 AM) General Not Present- Appetite Loss, Chills, Fatigue, Fever, Night Sweats, Weight Gain and Weight Loss. Skin Not Present- Change in Wart/Mole, Dryness, Hives, Jaundice, New Lesions, Non-Healing Wounds, Rash and Ulcer. HEENT Present- Seasonal Allergies and Wears glasses/contact lenses. Not Present- Earache, Hearing Loss, Hoarseness, Nose Bleed, Oral Ulcers, Ringing in the Ears, Sinus Pain, Sore Throat, Visual Disturbances and Yellow Eyes. Respiratory Not Present- Bloody sputum, Chronic Cough, Difficulty Breathing, Snoring and Wheezing. Breast Present- Breast Mass. Not Present- Breast Pain, Nipple Discharge and Skin Changes. Cardiovascular Not  Present- Chest Pain, Difficulty Breathing Lying Down, Leg Cramps, Palpitations, Rapid Heart Rate, Shortness of Breath and Swelling of Extremities. Gastrointestinal Present- Hemorrhoids and Nausea. Not Present- Abdominal Pain, Bloating, Bloody Stool, Change in Bowel Habits, Chronic diarrhea, Constipation, Difficulty Swallowing, Excessive gas, Gets full quickly at meals, Indigestion, Rectal Pain and Vomiting. Female Genitourinary Not Present- Frequency, Nocturia, Painful Urination, Pelvic Pain and Urgency. Musculoskeletal Not Present- Back Pain, Joint Pain, Joint Stiffness, Muscle Pain, Muscle Weakness and Swelling of Extremities. Neurological Not Present- Decreased Memory, Fainting, Headaches, Numbness, Seizures, Tingling, Tremor, Trouble walking and Weakness. Psychiatric Not Present- Anxiety, Bipolar, Change in Sleep Pattern, Depression, Fearful and Frequent crying. Endocrine Not Present- Cold Intolerance, Excessive Hunger, Hair Changes, Heat Intolerance, Hot flashes and New Diabetes. Hematology Not Present- Blood Thinners, Easy Bruising, Excessive bleeding, Gland problems, HIV and Persistent Infections.   Physical Exam (Thomas A. Cornett MD; 04/04/2017 10:45 AM)  General Mental Status-Alert. General Appearance-Consistent with stated age. Hydration-Well hydrated. Voice-Normal.  Head and Neck Head-normocephalic, atraumatic with no lesions or palpable masses. Trachea-midline. Thyroid Gland Characteristics - normal size and consistency.  Eye Eyeball - Bilateral-Extraocular movements intact. Sclera/Conjunctiva - Bilateral-No scleral icterus.  Chest and Lung Exam Chest and lung exam reveals -quiet, even and easy respiratory effort with no use of accessory muscles and on auscultation, normal breath sounds, no adventitious sounds and normal vocal resonance. Inspection Chest Wall - Normal. Back - normal.  Breast Note: both breast examined. Right br of the nipple complex.  around the superior aspect of the nipple complex. Bruising from biopsy on the right. Left breast is normal.Both breasts are small. No nuchal discharge noted bilaterally.  Cardiovascular Cardiovascular examination reveals -normal heart sounds, regular rate and rhythm with no murmurs and normal pedal pulses bilaterally.  Musculoskeletal Normal Exam - Left-Upper Extremity Strength Normal and Lower Extremity Strength Normal. Normal Exam - Right-Upper Extremity Strength Normal and Lower Extremity Strength Normal.  Lymphatic Head & Neck  General Head & Neck Lymphatics: Bilateral - Description - Normal. Axillary  General Axillary Region: Bilateral - Description - Normal. Tenderness -  Non Tender.    Assessment & Plan (Thomas A. Cornett MD; 04/04/2017 10:47 AM)  BREAST CANCER, RIGHT (C50.911) Impression: discussed surgical options today. Discussed mastectomy and reconstruction versus breast conservation. Discussed potential cosmetic issues with lumpectomy in the same breast on the right. Risks, benefits and alternatives to surgery were discussed. She would like to proceeomy and right axillary sentinel lymph node mapping.my and right axillary sentinel lymph node mapping. Risk of lumpectomy include bleeding, infection, seroma, more surgery, use of seed/wire, wound care, cosmetic deformity and the need for other treatments, death , blood clots, death. Pt agrees to proceed. Risk of sentinel lymph node mapping include bleeding, infection, lymphedema, shoulder pain. stiffness, dye allergy. cosmetic deformity , blood clots, death, need for more surgery. Pt agres to proceed.  Current Plans You are being scheduled for surgery- Our schedulers will call you.  You should hear from our office's scheduling department within 5 working days about the location, date, and time of surgery. We try to make accommodations for patient's preferences in scheduling surgery, but sometimes the OR schedule or the  surgeon's schedule prevents [place] from making those accommodations.  If you have not heard from our office [phone]) in 5 working days, call the office and ask for your surgeon's nurse.  If you have other questions about your diagnosis, plan, or surgery, call the office and ask for your surgeon's nurse.  Pt Education - [facility] Breast Cancer Information Given - [employer] "Breast Journey" Package We discussed the staging and pathophysiology of breast cancer. We discussed all of the different options for treatment for breast cancer including surgery, chemotherapy, radiation therapy, Herceptin, and antiestrogen therapy. We discussed a sentinel lymph node biopsy as she does not appear to having lymph node involvement right now. We discussed the performance of that with injection of radioactive tracer and blue dye. We discussed that she would have an incision underneath her axillary hairline. We discussed that there is a bout a 10-20% chance of having a positive node with a sentinel lymph node biopsy and we will await the permanent pathology to make any other first further decisions in terms of her treatment. One of these options might be to return to the operating room to perform an axillary lymph node dissection. We discussed about a 1-2% risk lifetime of chronic shoulder pain as well as lymphedema associated with a sentinel lymph node biopsy. We discussed the options for treatment of the breast cancer which included lumpectomy versus a mastectomy. We discussed the performance of the lumpectomy with a wire placement. We discussed a 10-20% chance of a positive margin requiring reexcision in the operating room. We also discussed that she may need radiation therapy or antiestrogen therapy or both if she undergoes lumpectomy. We discussed the mastectomy and the postoperative care for that as well. We discussed that there is no difference in her survival whether she undergoes lumpectomy with radiation therapy or  antiestrogen therapy versus a mastectomy. There is a slight difference in the local recurrence rate being 3-5% with lumpectomy and about 1% with a mastectomy. We discussed the risks of operation including bleeding, infection, possible reoperation. She understands her further therapy will be based on what her stages at the time of her operation.  Pt Education - ABC (After Breast Cancer) Class Info: discussed with patient and provided information. BREAST CANCER, RIGHT (C50.911)

## 2017-04-04 NOTE — Patient Instructions (Signed)

## 2017-04-05 NOTE — Addendum Note (Signed)
Encounter addended by: Kyung Rudd, MD on: 04/05/2017  1:16 PM<BR>    Actions taken: Edit attestation on clinical note

## 2017-04-11 ENCOUNTER — Telehealth: Payer: Self-pay | Admitting: *Deleted

## 2017-04-11 NOTE — Telephone Encounter (Signed)
Left message to follow up after [place] from 5/23.

## 2017-04-25 ENCOUNTER — Other Ambulatory Visit: Payer: Self-pay | Admitting: Surgery

## 2017-04-25 ENCOUNTER — Encounter (HOSPITAL_BASED_OUTPATIENT_CLINIC_OR_DEPARTMENT_OTHER): Payer: Self-pay | Admitting: *Deleted

## 2017-04-25 DIAGNOSIS — C50411 Malignant neoplasm of upper-outer quadrant of right female breast: Secondary | ICD-10-CM

## 2017-04-25 DIAGNOSIS — Z170 Estrogen receptor positive status [ER+]: Secondary | ICD-10-CM

## 2017-04-26 ENCOUNTER — Telehealth: Payer: Self-pay | Admitting: Hematology and Oncology

## 2017-04-26 NOTE — Telephone Encounter (Signed)
lvm to inform pt of 6/25 appt at 0930 per sch msg

## 2017-04-26 NOTE — Progress Notes (Signed)
Boost drink given with instructions to complete by 0052 dos, hibliclens soap given with instructions, pt verbalized understanding.

## 2017-04-27 ENCOUNTER — Ambulatory Visit
Admission: RE | Admit: 2017-04-27 | Discharge: 2017-04-27 | Disposition: A | Discharge status: Home / Self Care | Payer: BLUE CROSS/BLUE SHIELD | Source: Ambulatory Visit | Attending: Surgery | Admitting: Surgery

## 2017-04-27 DIAGNOSIS — C50411 Malignant neoplasm of upper-outer quadrant of right female breast: Secondary | ICD-10-CM

## 2017-04-27 DIAGNOSIS — Z170 Estrogen receptor positive status [ER+]: Secondary | ICD-10-CM

## 2017-04-30 ENCOUNTER — Ambulatory Visit (HOSPITAL_BASED_OUTPATIENT_CLINIC_OR_DEPARTMENT_OTHER): Payer: BLUE CROSS/BLUE SHIELD | Admitting: Anesthesiology

## 2017-04-30 ENCOUNTER — Ambulatory Visit (HOSPITAL_BASED_OUTPATIENT_CLINIC_OR_DEPARTMENT_OTHER)
Admission: RE | Admit: 2017-04-30 | Discharge: 2017-04-30 | Disposition: A | Discharge status: Home / Self Care | Payer: BLUE CROSS/BLUE SHIELD | Source: Ambulatory Visit | Attending: Surgery | Admitting: Surgery

## 2017-04-30 ENCOUNTER — Encounter (HOSPITAL_BASED_OUTPATIENT_CLINIC_OR_DEPARTMENT_OTHER): Payer: Self-pay | Admitting: Anesthesiology

## 2017-04-30 ENCOUNTER — Ambulatory Visit
Admission: RE | Admit: 2017-04-30 | Discharge: 2017-04-30 | Disposition: A | Discharge status: Home / Self Care | Payer: BLUE CROSS/BLUE SHIELD | Source: Ambulatory Visit | Attending: Surgery | Admitting: Surgery

## 2017-04-30 ENCOUNTER — Encounter (HOSPITAL_COMMUNITY)
Admission: RE | Admit: 2017-04-30 | Discharge: 2017-04-30 | Disposition: A | Discharge status: Home / Self Care | Payer: BLUE CROSS/BLUE SHIELD | Source: Ambulatory Visit | Attending: Surgery | Admitting: Surgery

## 2017-04-30 ENCOUNTER — Encounter (HOSPITAL_BASED_OUTPATIENT_CLINIC_OR_DEPARTMENT_OTHER)
Admission: RE | Disposition: A | Discharge status: Home / Self Care | Payer: Self-pay | Source: Ambulatory Visit | Attending: Surgery

## 2017-04-30 DIAGNOSIS — C50411 Malignant neoplasm of upper-outer quadrant of right female breast: Secondary | ICD-10-CM

## 2017-04-30 DIAGNOSIS — Z885 Allergy status to narcotic agent status: Secondary | ICD-10-CM | POA: Insufficient documentation

## 2017-04-30 DIAGNOSIS — Z8249 Family history of ischemic heart disease and other diseases of the circulatory system: Secondary | ICD-10-CM | POA: Insufficient documentation

## 2017-04-30 DIAGNOSIS — Z8261 Family history of arthritis: Secondary | ICD-10-CM | POA: Insufficient documentation

## 2017-04-30 DIAGNOSIS — Z170 Estrogen receptor positive status [ER+]: Secondary | ICD-10-CM

## 2017-04-30 DIAGNOSIS — Z8042 Family history of malignant neoplasm of prostate: Secondary | ICD-10-CM | POA: Diagnosis not present

## 2017-04-30 HISTORY — PX: BREAST LUMPECTOMY WITH RADIOACTIVE SEED AND SENTINEL LYMPH NODE BIOPSY: SHX6550

## 2017-04-30 SURGERY — BREAST LUMPECTOMY WITH RADIOACTIVE SEED AND SENTINEL LYMPH NODE BIOPSY
Anesthesia: General | Site: Breast | Laterality: Right

## 2017-04-30 MED ORDER — CEFAZOLIN SODIUM-DEXTROSE 2-3 GM-% IV SOLR
INTRAVENOUS | Status: DC | PRN
  Administered 2017-04-30: 2 g via INTRAVENOUS

## 2017-04-30 MED ORDER — FENTANYL CITRATE (PF) 100 MCG/2ML IJ SOLN
INTRAMUSCULAR | Status: AC
  Filled 2017-04-30: qty 2

## 2017-04-30 MED ORDER — ONDANSETRON HCL 4 MG/2ML IJ SOLN
INTRAMUSCULAR | Status: AC
Start: 2017-04-30 — End: ?
  Filled 2017-04-30: qty 2

## 2017-04-30 MED ORDER — BUPIVACAINE-EPINEPHRINE (PF) 0.25% -1:200000 IJ SOLN
INTRAMUSCULAR | Status: AC
  Filled 2017-04-30: qty 90

## 2017-04-30 MED ORDER — CHLORHEXIDINE GLUCONATE CLOTH 2 % EX PADS: 6.0000 | MEDICATED_PAD | Freq: Once | CUTANEOUS | Status: DC

## 2017-04-30 MED ORDER — LIDOCAINE 2% (20 MG/ML) 5 ML SYRINGE
INTRAMUSCULAR | Status: AC
  Filled 2017-04-30: qty 5

## 2017-04-30 MED ORDER — FENTANYL CITRATE (PF) 100 MCG/2ML IJ SOLN
25.0000 ug | INTRAMUSCULAR | Status: DC | PRN
  Administered 2017-04-30: 25 ug via INTRAVENOUS
  Administered 2017-04-30: 50 ug via INTRAVENOUS
  Administered 2017-04-30: 25 ug via INTRAVENOUS

## 2017-04-30 MED ORDER — GABAPENTIN 300 MG PO CAPS
300.0000 mg | ORAL_CAPSULE | ORAL | Status: AC
  Administered 2017-04-30: 300 mg via ORAL

## 2017-04-30 MED ORDER — HEMOSTATIC AGENTS (NO CHARGE) OPTIME
TOPICAL | Status: DC | PRN
  Administered 2017-04-30: 1 via TOPICAL

## 2017-04-30 MED ORDER — FENTANYL CITRATE (PF) 100 MCG/2ML IJ SOLN
INTRAMUSCULAR | Status: DC | PRN
  Administered 2017-04-30 (×3): 25 ug via INTRAVENOUS

## 2017-04-30 MED ORDER — 0.9 % SODIUM CHLORIDE (POUR BTL) OPTIME
TOPICAL | Status: DC | PRN
  Administered 2017-04-30: 1000 mL

## 2017-04-30 MED ORDER — SCOPOLAMINE 1 MG/3DAYS TD PT72
1.0000 | MEDICATED_PATCH | Freq: Once | TRANSDERMAL | Status: DC | PRN
  Administered 2017-04-30: 1.5 mg via TRANSDERMAL

## 2017-04-30 MED ORDER — CEFAZOLIN SODIUM-DEXTROSE 2-4 GM/100ML-% IV SOLN
INTRAVENOUS | Status: AC
  Filled 2017-04-30: qty 100

## 2017-04-30 MED ORDER — PROPOFOL 10 MG/ML IV BOLUS
INTRAVENOUS | Status: AC
  Filled 2017-04-30: qty 20

## 2017-04-30 MED ORDER — CELECOXIB 400 MG PO CAPS
400.0000 mg | ORAL_CAPSULE | ORAL | Status: AC
  Administered 2017-04-30: 400 mg via ORAL

## 2017-04-30 MED ORDER — MIDAZOLAM HCL 2 MG/2ML IJ SOLN
1.0000 mg | INTRAMUSCULAR | Status: DC | PRN
Start: 2017-04-30 — End: 2017-04-30
  Administered 2017-04-30 (×2): 1 mg via INTRAVENOUS

## 2017-04-30 MED ORDER — ACETAMINOPHEN 500 MG PO TABS
1000.0000 mg | ORAL_TABLET | ORAL | Status: AC
  Administered 2017-04-30: 1000 mg via ORAL

## 2017-04-30 MED ORDER — GABAPENTIN 300 MG PO CAPS
ORAL_CAPSULE | ORAL | Status: AC
  Filled 2017-04-30: qty 1

## 2017-04-30 MED ORDER — PROMETHAZINE HCL 25 MG/ML IJ SOLN
6.2500 mg | INTRAMUSCULAR | Status: DC | PRN
  Administered 2017-04-30: 6.25 mg via INTRAVENOUS

## 2017-04-30 MED ORDER — PROMETHAZINE HCL 25 MG/ML IJ SOLN
INTRAMUSCULAR | Status: AC
  Filled 2017-04-30: qty 1

## 2017-04-30 MED ORDER — DEXTROSE 5 % IV SOLN: 3.0000 g | INTRAVENOUS | Status: DC

## 2017-04-30 MED ORDER — IBUPROFEN 800 MG PO TABS: 800.0000 mg | ORAL_TABLET | Freq: Three times a day (TID) | ORAL | 0 refills | Status: DC | PRN

## 2017-04-30 MED ORDER — FENTANYL CITRATE (PF) 100 MCG/2ML IJ SOLN
50.0000 ug | INTRAMUSCULAR | Status: DC | PRN
  Administered 2017-04-30 (×2): 50 ug via INTRAVENOUS

## 2017-04-30 MED ORDER — LACTATED RINGERS IV SOLN
INTRAVENOUS | Status: DC
  Administered 2017-04-30 (×2): via INTRAVENOUS

## 2017-04-30 MED ORDER — ROPIVACAINE HCL 5 MG/ML IJ SOLN
INTRAMUSCULAR | Status: DC | PRN
  Administered 2017-04-30: 30 mL via PERINEURAL

## 2017-04-30 MED ORDER — OXYCODONE HCL 5 MG PO TABS: 5.0000 mg | ORAL_TABLET | Freq: Four times a day (QID) | ORAL | 0 refills | Status: DC | PRN

## 2017-04-30 MED ORDER — MIDAZOLAM HCL 2 MG/2ML IJ SOLN
INTRAMUSCULAR | Status: AC
  Filled 2017-04-30: qty 2

## 2017-04-30 MED ORDER — LIDOCAINE HCL (CARDIAC) 20 MG/ML IV SOLN
INTRAVENOUS | Status: DC | PRN
  Administered 2017-04-30: 30 mg via INTRAVENOUS

## 2017-04-30 MED ORDER — SCOPOLAMINE 1 MG/3DAYS TD PT72
MEDICATED_PATCH | TRANSDERMAL | Status: AC
  Filled 2017-04-30: qty 1

## 2017-04-30 MED ORDER — DEXAMETHASONE SODIUM PHOSPHATE 4 MG/ML IJ SOLN
INTRAMUSCULAR | Status: DC | PRN
  Administered 2017-04-30: 10 mg via INTRAVENOUS

## 2017-04-30 MED ORDER — TECHNETIUM TC 99M SULFUR COLLOID FILTERED
1.0000 | Freq: Once | INTRAVENOUS | Status: AC | PRN
  Administered 2017-04-30: 1 via INTRADERMAL

## 2017-04-30 MED ORDER — KETOROLAC TROMETHAMINE 30 MG/ML IJ SOLN: 30.0000 mg | Freq: Once | INTRAMUSCULAR | Status: DC | PRN

## 2017-04-30 MED ORDER — DEXAMETHASONE SODIUM PHOSPHATE 10 MG/ML IJ SOLN
INTRAMUSCULAR | Status: AC
  Filled 2017-04-30: qty 1

## 2017-04-30 MED ORDER — ONDANSETRON HCL 4 MG PO TABS: 4.0000 mg | ORAL_TABLET | Freq: Three times a day (TID) | ORAL | 0 refills | Status: DC | PRN

## 2017-04-30 MED ORDER — SODIUM CHLORIDE 0.9 % IJ SOLN
INTRAMUSCULAR | Status: AC
  Filled 2017-04-30: qty 10

## 2017-04-30 MED ORDER — METHYLENE BLUE 0.5 % INJ SOLN
INTRAVENOUS | Status: AC
  Filled 2017-04-30: qty 10

## 2017-04-30 MED ORDER — BUPIVACAINE-EPINEPHRINE (PF) 0.25% -1:200000 IJ SOLN
INTRAMUSCULAR | Status: DC | PRN
  Administered 2017-04-30: 11 mL

## 2017-04-30 MED ORDER — MIDAZOLAM HCL 5 MG/5ML IJ SOLN
INTRAMUSCULAR | Status: DC | PRN
  Administered 2017-04-30: 2 mg via INTRAVENOUS

## 2017-04-30 MED ORDER — PROPOFOL 10 MG/ML IV BOLUS
INTRAVENOUS | Status: DC | PRN
  Administered 2017-04-30: 150 mg via INTRAVENOUS

## 2017-04-30 MED ORDER — ACETAMINOPHEN 500 MG PO TABS
ORAL_TABLET | ORAL | Status: AC
  Filled 2017-04-30: qty 2

## 2017-04-30 MED ORDER — CELECOXIB 200 MG PO CAPS
ORAL_CAPSULE | ORAL | Status: AC
  Filled 2017-04-30: qty 2

## 2017-04-30 MED ORDER — ONDANSETRON HCL 4 MG/2ML IJ SOLN
INTRAMUSCULAR | Status: DC | PRN
  Administered 2017-04-30: 4 mg via INTRAVENOUS

## 2017-04-30 SURGICAL SUPPLY — 48 items
ADH SKN CLS APL DERMABOND .7 (GAUZE/BANDAGES/DRESSINGS) ×1
APPLIER CLIP 9.375 MED OPEN (MISCELLANEOUS) ×2
APR CLP MED 9.3 20 MLT OPN (MISCELLANEOUS) ×1
BINDER BREAST LRG (GAUZE/BANDAGES/DRESSINGS) IMPLANT
BINDER BREAST MEDIUM (GAUZE/BANDAGES/DRESSINGS) ×1 IMPLANT
BINDER BREAST XLRG (GAUZE/BANDAGES/DRESSINGS) IMPLANT
BINDER BREAST XXLRG (GAUZE/BANDAGES/DRESSINGS) IMPLANT
BLADE SURG 15 STRL LF DISP TIS (BLADE) ×1 IMPLANT
BLADE SURG 15 STRL SS (BLADE) ×2
CANISTER SUC SOCK COL 7IN (MISCELLANEOUS) IMPLANT
CANISTER SUCT 1200ML W/VALVE (MISCELLANEOUS) ×2 IMPLANT
CHLORAPREP W/TINT 26ML (MISCELLANEOUS) ×2 IMPLANT
CLIP APPLIE 9.375 MED OPEN (MISCELLANEOUS) ×1 IMPLANT
COVER BACK TABLE 60X90IN (DRAPES) ×2 IMPLANT
COVER MAYO STAND STRL (DRAPES) ×2 IMPLANT
COVER PROBE W GEL 5X96 (DRAPES) ×2 IMPLANT
DERMABOND ADVANCED (GAUZE/BANDAGES/DRESSINGS) ×1
DERMABOND ADVANCED .7 DNX12 (GAUZE/BANDAGES/DRESSINGS) ×1 IMPLANT
DEVICE DUBIN W/COMP PLATE 8390 (MISCELLANEOUS) ×2 IMPLANT
DRAPE LAPAROSCOPIC ABDOMINAL (DRAPES) ×2 IMPLANT
DRAPE UTILITY XL STRL (DRAPES) ×2 IMPLANT
ELECT COATED BLADE 2.86 ST (ELECTRODE) ×2 IMPLANT
ELECT REM PT RETURN 9FT ADLT (ELECTROSURGICAL) ×2
ELECTRODE REM PT RTRN 9FT ADLT (ELECTROSURGICAL) ×1 IMPLANT
GLOVE BIOGEL PI IND STRL 8 (GLOVE) ×1 IMPLANT
GLOVE BIOGEL PI INDICATOR 8 (GLOVE) ×1
GLOVE ECLIPSE 8.0 STRL XLNG CF (GLOVE) ×2 IMPLANT
GOWN STRL REUS W/ TWL LRG LVL3 (GOWN DISPOSABLE) ×2 IMPLANT
GOWN STRL REUS W/TWL LRG LVL3 (GOWN DISPOSABLE) ×4
HEMOSTAT ARISTA ABSORB 3G PWDR (MISCELLANEOUS) IMPLANT
HEMOSTAT SNOW SURGICEL 2X4 (HEMOSTASIS) ×2 IMPLANT
KIT MARKER MARGIN INK (KITS) ×2 IMPLANT
NDL HYPO 25X1 1.5 SAFETY (NEEDLE) ×1 IMPLANT
NDL SAFETY ECLIPSE 18X1.5 (NEEDLE) IMPLANT
NEEDLE HYPO 18GX1.5 SHARP (NEEDLE)
NEEDLE HYPO 25X1 1.5 SAFETY (NEEDLE) ×2 IMPLANT
NS IRRIG 1000ML POUR BTL (IV SOLUTION) ×2 IMPLANT
PACK BASIN DAY SURGERY FS (CUSTOM PROCEDURE TRAY) ×2 IMPLANT
PENCIL BUTTON HOLSTER BLD 10FT (ELECTRODE) ×2 IMPLANT
SLEEVE SCD COMPRESS KNEE MED (MISCELLANEOUS) ×2 IMPLANT
SPONGE LAP 4X18 X RAY DECT (DISPOSABLE) ×2 IMPLANT
SUT MNCRL AB 4-0 PS2 18 (SUTURE) ×2 IMPLANT
SUT VICRYL 3-0 CR8 SH (SUTURE) ×2 IMPLANT
SYR CONTROL 10ML LL (SYRINGE) ×2 IMPLANT
TOWEL OR 17X24 6PK STRL BLUE (TOWEL DISPOSABLE) ×2 IMPLANT
TOWEL OR NON WOVEN STRL DISP B (DISPOSABLE) ×2 IMPLANT
TUBE CONNECTING 20X1/4 (TUBING) ×2 IMPLANT
YANKAUER SUCT BULB TIP NO VENT (SUCTIONS) ×2 IMPLANT

## 2017-04-30 NOTE — Anesthesia Procedure Notes (Signed)
Anesthesia Procedure Image    

## 2017-04-30 NOTE — Anesthesia Preprocedure Evaluation (Signed)
Anesthesia Evaluation  Patient identified by MRN, date of birth, ID band Patient awake    Reviewed: Allergy & Precautions, NPO status , Patient's Chart, lab work & pertinent test results  Airway Mallampati: II  TM Distance: >3 FB Neck ROM: Full    Dental no notable dental hx.    Pulmonary neg pulmonary ROS,    Pulmonary exam normal breath sounds clear to auscultation       Cardiovascular negative cardio ROS Normal cardiovascular exam Rhythm:Regular Rate:Normal     Neuro/Psych negative neurological ROS  negative psych ROS   GI/Hepatic negative GI ROS, Neg liver ROS,   Endo/Other  negative endocrine ROS  Renal/GU negative Renal ROS  negative genitourinary   Musculoskeletal negative musculoskeletal ROS (+)   Abdominal   Peds negative pediatric ROS (+)  Hematology negative hematology ROS (+)   Anesthesia Other Findings   Reproductive/Obstetrics negative OB ROS                             Anesthesia Physical Anesthesia Plan  ASA: II  Anesthesia Plan: General   Post-op Pain Management:  Regional for Post-op pain   Induction: Intravenous  PONV Risk Score and Plan: 1 and Ondansetron and Dexamethasone  Airway Management Planned: LMA  Additional Equipment:   Intra-op Plan:   Post-operative Plan:   Informed Consent: I have reviewed the patients History and Physical, chart, labs and discussed the procedure including the risks, benefits and alternatives for the proposed anesthesia with the patient or authorized representative who has indicated his/her understanding and acceptance.   Dental advisory given  Plan Discussed with: CRNA and Surgeon  Anesthesia Plan Comments:         Anesthesia Quick Evaluation

## 2017-04-30 NOTE — Anesthesia Postprocedure Evaluation (Signed)
Anesthesia Post Note  Patient: [name]  Procedure(s) Performed: Procedure(s) (LRB): BREAST LUMPECTOMY WITH RADIOACTIVE SEED AND AXILLARY SENTINEL LYMPH NODE BIOPSY (Right)     Patient location during evaluation: PACU Anesthesia Type: General and Regional Level of consciousness: awake and alert Pain management: pain level controlled Vital Signs Assessment: post-procedure vital signs reviewed and stable Respiratory status: spontaneous breathing, nonlabored ventilation, respiratory function stable and patient connected to nasal cannula oxygen Cardiovascular status: blood pressure returned to baseline and stable Postop Assessment: no signs of nausea or vomiting Anesthetic complications: no    Last Vitals:  Vitals:   04/30/17 1230 04/30/17 1245  BP: 121/85 (!) 141/94  Pulse: 80 87  Resp: 16 (!) 25  Temp:      Last Pain:  Vitals:   04/30/17 1220  TempSrc:   PainSc: 0-No pain                 Alvera Tourigny S

## 2017-04-30 NOTE — Transfer of Care (Signed)
Immediate Anesthesia Transfer of Care Note  Patient: Brandi Lewis  Procedure(s) Performed: Procedure(s): BREAST LUMPECTOMY WITH RADIOACTIVE SEED AND AXILLARY SENTINEL LYMPH NODE BIOPSY (Right)  Patient Location: PACU  Anesthesia Type:GA combined with regional for post-op pain  Level of Consciousness: sedated  Airway & Oxygen Therapy: Patient Spontanous Breathing and Patient connected to face mask oxygen  Post-op Assessment: Report given to RN and Post -op Vital signs reviewed and stable  Post vital signs: Reviewed and stable  Last Vitals:  Vitals:   04/30/17 1050 04/30/17 1055  BP: 116/78 117/69  Pulse: 94 95  Resp: 16   Temp:      Last Pain:  Vitals:   04/30/17 0939  TempSrc: Oral         Complications: No apparent anesthesia complications

## 2017-04-30 NOTE — Interval H&P Note (Signed)
History and Physical Interval Note:  04/30/2017 11:04 AM  Brandi Lewis  has presented today for surgery, with the diagnosis of RIGHT BREAST CANCER  The various methods of treatment have been discussed with the patient and family. After consideration of risks, benefits and other options for treatment, the patient has consented to  Procedure(s): BREAST LUMPECTOMY WITH RADIOACTIVE SEED AND SENTINEL LYMPH NODE BIOPSY (Right) as a surgical intervention .  The patient's history has been reviewed, patient examined, no change in status, stable for surgery.  I have reviewed the patient's chart and labs.  Questions were answered to the patient's satisfaction.     Leonidas Boateng A.

## 2017-04-30 NOTE — Discharge Instructions (Signed)
[place] Office Phone Number [phone]  BREAST BIOPSY/ PARTIAL MASTECTOMY: POST OP INSTRUCTIONS  Always review your discharge instruction sheet given to you by the facility where your surgery was performed.  IF YOU HAVE DISABILITY OR FAMILY LEAVE FORMS, YOU MUST BRING THEM TO THE OFFICE FOR PROCESSING.  DO NOT GIVE THEM TO YOUR DOCTOR.  1. A prescription for pain medication may be given to you upon discharge.  Take your pain medication as prescribed, if needed.  If narcotic pain medicine is not needed, then you may take acetaminophen (Tylenol) or ibuprofen (Advil) as needed. 2. Take your usually prescribed medications unless otherwise directed 3. If you need a refill on your pain medication, please contact your pharmacy.  They will contact our office to request authorization.  Prescriptions will not be filled after 5pm or on week-ends. 4. You should eat very light the first 24 hours after surgery, such as soup, crackers, pudding, etc.  Resume your normal diet the day after surgery. 5. Most patients will experience some swelling and bruising in the breast.  Ice packs and a good support bra will help.  Swelling and bruising can take several days to resolve.  6. It is common to experience some constipation if taking pain medication after surgery.  Increasing fluid intake and taking a stool softener will usually help or prevent this problem from occurring.  A mild laxative (Milk of Magnesia or Miralax) should be taken according to package directions if there are no bowel movements after 48 hours. 7. Unless discharge instructions indicate otherwise, you may remove your bandages 24-48 hours after surgery, and you may shower at that time.  You may have steri-strips (small skin tapes) in place directly over the incision.  These strips should be left on the skin for 7-10 days.  If your surgeon used skin glue on the incision, you may shower in 24 hours.  The glue will flake off over the  next 2-3 weeks.  Any sutures or staples will be removed at the office during your follow-up visit. 8. ACTIVITIES:  You may resume regular daily activities (gradually increasing) beginning the next day.  Wearing a good support bra or sports bra minimizes pain and swelling.  You may have sexual intercourse when it is comfortable. a. You may drive when you no longer are taking prescription pain medication, you can comfortably wear a seatbelt, and you can safely maneuver your car and apply brakes. b. RETURN TO WORK:  _________2 weeks _____________________________________________________________________________ 9. You should see your doctor in the office for a follow-up appointment approximately two weeks after your surgery.  Your doctors nurse will typically make your follow-up appointment when she calls you with your pathology report.  Expect your pathology report 2-3 business days after your surgery.  You may call to check if you do not hear from [place] after three days. 10. OTHER INSTRUCTIONS: _______________________________________________________________________________________________ _____________________________________________________________________________________________________________________________________ _____________________________________________________________________________________________________________________________________ _____________________________________________________________________________________________________________________________________  WHEN TO CALL YOUR DOCTOR: 1. Fever over 101.0 2. Nausea and/or vomiting. 3. Extreme swelling or bruising. 4. Continued bleeding from incision. 5. Increased pain, redness, or drainage from the incision.  The clinic staff is available to answer your questions during regular business hours.  Please dont hesitate to call and ask to speak to one of the nurses for clinical concerns.  If you have a medical emergency, go to the  nearest emergency room or call 911.  A surgeon from [facility] is always on call at the hospital.  For further questions, please visit  [email or web address]        Post Anesthesia Home Care Instructions  Activity: Get plenty of rest for the remainder of the day. A responsible individual must stay with you for 24 hours following the procedure.  For the next 24 hours, DO NOT: -Drive a car -Paediatric nurse -Drink alcoholic beverages -Take any medication unless instructed by your physician -Make any legal decisions or sign important papers.  Meals: Start with liquid foods such as gelatin or soup. Progress to regular foods as tolerated. Avoid greasy, spicy, heavy foods. If nausea and/or vomiting occur, drink only clear liquids until the nausea and/or vomiting subsides. Call your physician if vomiting continues.  Special Instructions/Symptoms: Your throat may feel dry or sore from the anesthesia or the breathing tube placed in your throat during surgery. If this causes discomfort, gargle with warm salt water. The discomfort should disappear within 24 hours.  If you had a scopolamine patch placed behind your ear for the management of post- operative nausea and/or vomiting:  1. The medication in the patch is effective for 72 hours, after which it should be removed.  Wrap patch in a tissue and discard in the trash. Wash hands thoroughly with soap and water. 2. You may remove the patch earlier than 72 hours if you experience unpleasant side effects which may include dry mouth, dizziness or visual disturbances. 3. Avoid touching the patch. Wash your hands with soap and water after contact with the patch.

## 2017-04-30 NOTE — Anesthesia Procedure Notes (Signed)
Anesthesia Regional Block: Pectoralis block   Pre-Anesthetic Checklist: ,, timeout performed, Correct Patient, Correct Site, Correct Laterality, Correct Procedure, Correct Position, site marked, Risks and benefits discussed,  Surgical consent,  Pre-op evaluation,  At surgeon's request and post-op pain management  Laterality: Right  Prep: chloraprep       Needles:  Injection technique: Single-shot  Needle Type: Echogenic Needle     Needle Length: 9cm      Additional Needles:   Procedures: ultrasound guided,,,,,,,,  Narrative:  Start time: 04/30/2017 10:21 AM End time: 04/30/2017 10:32 AM Injection made incrementally with aspirations every 5 mL.  Performed by: Personally  Anesthesiologist: Jabarri Stefanelli  Additional Notes: Patient tolerated the procedure well without complications

## 2017-04-30 NOTE — Op Note (Addendum)
Preoperative diagnosis: Stage I right breast cancer upper outer quadrant  Postoperative diagnosis: Same  Procedure: Right breast seed localized partial mastectomy with right axillary sentinel lymph node mapping  Surgeon: Erroll Luna M.D.  Anesthesia: LMA with pec block and local   Block anesthesia  EBL: 10 mL  Specimens: Right breast mass with clip specimen. See was separated from the mass and sent separately. 3 right axillary sentinel nodes hot  Drains: None  IV fluids: Per anesthesia  Indications for procedure: The patient presents for breast conservation surgery for her stage I right breast cancer. She was seen and evaluated in the multidisciplinary clinic and surgical options were discussed. Rest conservation versus mastectomy and reconstruction were discussed as lymph node mapping with discussed. Risks, benefits and alternatives to surgery were discussed.The procedure has been discussed with the patient. Alternatives to surgery have been discussed with the patient.  Risks of surgery include bleeding,  Infection,  Seroma formation, death,  and the need for further surgery.   The patient understands and wishes to proceed.Sentinel lymph node mapping and dissection has been discussed with the patient.  Risk of bleeding,  Infection,  Seroma formation,  Additional procedures,,  Shoulder weakness ,  Shoulder stiffness,  Nerve and blood vessel injury and reaction to the mapping dyes have been discussed.  Alternatives to surgery have been discussed with the patient.  The patient agrees to proceed.  Description of procedure: The patient was met in the holding area and questions are answered. The right breast was marked with her side. The patient had seen you to place the right breast earlier last week. She was taken back to the operating room placed upon the OR table. Induction of LMA anesthesia right breast was prepped and draped in sterile fashion. She received extra block procedure. Timeout  was done. The correct patient and site were verified. The neoprobe was used to identify the seen in the right breast in the upper outer quadrant. Curvilinear incision made around the nipple complex. Dissection was carried down to excise all tissue around the mass that was palpable. Seed separated from the mass. It was retrieved and placed in a separate container. It was verified by x-ray. The clip was found in the mass. Marked and sent to pathology. The cavity was irrigated. Clips are placed to mark cavity. Of note the deep margin was pectoralis fashion. Margins were grossly negative. Was closed with 3-0 Vicryl.  The neoprobe was used to identify the right axillary damage. Settings were placed on technetium. 3 cm incision made in the right axillary inferior hairline. Dissection was carried into level I axillary contents which were deep. Identify 3 hot nodes in background counts approached 0. The wound was irrigated. Surgicel snow placed in the cavity cavity closed with 3-0 Vicryl and 4-0 Monocryl.   Dermabond applied to both to both. Breast Binder placed. All final counts found to be correct.  The patient was awoke extubated taken to recovery in satisfactory condition.

## 2017-04-30 NOTE — Anesthesia Procedure Notes (Signed)
Procedure Name: LMA Insertion Date/Time: 04/30/2017 11:11 AM Performed by: Toula Moos L Pre-anesthesia Checklist: Patient identified, Emergency Drugs available, Suction available, Patient being monitored and Timeout performed Patient Re-evaluated:Patient Re-evaluated prior to inductionOxygen Delivery Method: Circle system utilized Preoxygenation: Pre-oxygenation with 100% oxygen Intubation Type: IV induction Ventilation: Mask ventilation without difficulty LMA: LMA inserted LMA Size: 4.0 Number of attempts: 1 Airway Equipment and Method: Bite block Placement Confirmation: positive ETCO2 Tube secured with: Tape Dental Injury: Teeth and Oropharynx as per pre-operative assessment

## 2017-04-30 NOTE — H&P (View-Only) (Signed)
Brandi Lewis 04/04/2017 7:27 AM Location: [facility] Patient #: [id number] DOB: 02/10/1966 Married / Language: English / Race: Black or African American Female  History of Present Illness Brandi Moores A. Rocquel Askren MD; 04/04/2017 10:44 AM) Patient words: patient said at the request of Dr. cousins after abnormal mammogram. The patient went for her annual screening mammogram. 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 patient denies history of breast pain, breast mass or nipple discharge. There is no family history of breast cancer.         ADDENDUM REPORT: 03/26/2017 16:46 ADDENDUM: The right axilla was scanned at the time of biopsy on 03/26/2017. There is no evidence of right axillary lymphadenopathy. Electronically Signed By: Brandi Lewis M.D. On: 03/26/2017 16:46 Addended by Brandi Kato, MD on 03/26/2017 4:48 PM  Study Result CLINICAL DATA: Patient returns today to evaluate a possible right breast asymmetry questioned on recent screening mammogram. History of benign right breast excisional biopsies x2. EXAM: 2D DIGITAL DIAGNOSTIC RIGHT MAMMOGRAM WITH CAD AND ADJUNCT TOMO ULTRASOUND RIGHT BREAST COMPARISON: Previous exams including recent screening mammogram dated 03/16/2017. ACR Breast Density Category c: The breast tissue is heterogeneously dense, which may obscure small masses. FINDINGS: On today's additional views with spot compression and 3D tomosynthesis, there is a persistent asymmetry within the upper right breast, with surrounding distortion compatible with patient's history of previous surgical excision. Mammographic images were processed with CAD. Targeted ultrasound is performed, showing an irregular hypoechoic mass in the right breast at the 11 o'clock axis, 2 cm  from the nipple, with internal vascularity, measuring 1.4 x 0.7 x 0.7 cm, a possible correlate for the mammographic finding. IMPRESSION: Irregular mass in the right breast at the 11 o'clock axis, 2 cm from the nipple, measuring 1.4 x 0.7 x 0.7 cm, a possible correlate for the mammographic asymmetry. Ultrasound-guided biopsy is recommended RECOMMENDATION: Ultrasound-guided biopsy of the right breast mass at the 11 o'clock axis. Ultrasound-guided biopsy is scheduled for May 14th at 3:45 p.m. I have discussed the findings and recommendations with the patient. Results were also provided in writing at the conclusion of the visit. If applicable, a reminder letter will be sent to the patient regarding the next appointment. BI-RADS CATEGORY 4: Suspicious. Electronically Signed: By: Brandi Lewis M.D. On: 03/23/2017 09:05             ADDITIONAL INFORMATION: PROGNOSTIC INDICATORS Results: IMMUNOHISTOCHEMICAL AND MORPHOMETRIC ANALYSIS PERFORMED MANUALLY Estrogen Receptor: 90%, POSITIVE, STRONG STAINING INTENSITY Progesterone Receptor: 90%, POSITIVE, STRONG STAINING INTENSITY Proliferation Marker Ki67: 15% REFERENCE RANGE ESTROGEN RECEPTOR NEGATIVE 0% POSITIVE =>1% REFERENCE RANGE PROGESTERONE RECEPTOR NEGATIVE 0% POSITIVE =>1% All controls stained appropriately Brandi Laws MD Pathologist, Electronic Signature ( Signed 03/30/2017) FLUORESCENCE IN-SITU HYBRIDIZATION Results: HER2 - NEGATIVE RATIO OF HER2/CEP17 SIGNALS 1.05 AVERAGE HER2 COPY NUMBER PER CELL 1.15 Reference Range: NEGATIVE HER2/CEP17 Ratio <2.0 and average HER2 copy number <4.0 EQUIVOCAL HER2/CEP17 Ratio <2.0 and average HER2 copy number >=4.0 and <6.0 1 of 3 FINAL for MINH, JASPER [id number]) ADDITIONAL INFORMATION:(continued) POSITIVE HER2/CEP17 Ratio >=2.0 or <2.0 and average HER2 copy number >=6.0 Brandi Cutter MD Pathologist, Electronic Signature ( Signed 03/29/2017) The malignant cells are  positive for E-Cadherin, supporting a ductal phenotype. (JBK:kh 03/27/17) Brandi Cutter MD Pathologist, Electronic Signature.  The patient is a 51 year old female.   Past Surgical History Brandi Slipper, RN; 04/04/2017 7:27  AM) Breast Biopsy Bilateral. multiple Thyroid Surgery  Diagnostic Studies History (Brandi Smith, RN; 04/04/2017 7:27 AM) Colonoscopy 1-5 years ago Mammogram within last year 1-3 years ago Pap Smear 1-5 years ago  Medication History (Brandi Smith, RN; 04/04/2017 7:27 AM) Medications Reconciled  Social History (Brandi Smith, RN; 04/04/2017 7:27 AM) Caffeine use Carbonated beverages, Coffee, Tea. No alcohol use No drug use Tobacco use Never smoker.  Family History (Brandi Smith, RN; 04/04/2017 7:27 AM) Arthritis Sister. Hypertension Mother, Sister. Migraine Headache Family Members In General, Mother, Sister. Prostate Cancer Family Members In General, Father.  Pregnancy / Birth History (Brandi Smith, RN; 04/04/2017 7:27 AM) Age at menarche 10 years, 11 years. Contraceptive History Oral contraceptives. Gravida 1 Maternal age 26-30 Para 1  Other Problems (Brandi Smith, RN; 04/04/2017 7:27 AM) Hemorrhoids Lump In Breast Migraine Headache Thyroid Disease     Review of Systems (Brandi Smith RN; 04/04/2017 7:27 AM) General Not Present- Appetite Loss, Chills, Fatigue, Fever, Night Sweats, Weight Gain and Weight Loss. Skin Not Present- Change in Wart/Mole, Dryness, Hives, Jaundice, New Lesions, Non-Healing Wounds, Rash and Ulcer. HEENT Present- Seasonal Allergies and Wears glasses/contact lenses. Not Present- Earache, Hearing Loss, Hoarseness, Nose Bleed, Oral Ulcers, Ringing in the Ears, Sinus Pain, Sore Throat, Visual Disturbances and Yellow Eyes. Respiratory Not Present- Bloody sputum, Chronic Cough, Difficulty Breathing, Snoring and Wheezing. Breast Present- Breast Mass. Not Present- Breast Pain, Nipple Discharge and Skin Changes. Cardiovascular Not  Present- Chest Pain, Difficulty Breathing Lying Down, Leg Cramps, Palpitations, Rapid Heart Rate, Shortness of Breath and Swelling of Extremities. Gastrointestinal Present- Hemorrhoids and Nausea. Not Present- Abdominal Pain, Bloating, Bloody Stool, Change in Bowel Habits, Chronic diarrhea, Constipation, Difficulty Swallowing, Excessive gas, Gets full quickly at meals, Indigestion, Rectal Pain and Vomiting. Female Genitourinary Not Present- Frequency, Nocturia, Painful Urination, Pelvic Pain and Urgency. Musculoskeletal Not Present- Back Pain, Joint Pain, Joint Stiffness, Muscle Pain, Muscle Weakness and Swelling of Extremities. Neurological Not Present- Decreased Memory, Fainting, Headaches, Numbness, Seizures, Tingling, Tremor, Trouble walking and Weakness. Psychiatric Not Present- Anxiety, Bipolar, Change in Sleep Pattern, Depression, Fearful and Frequent crying. Endocrine Not Present- Cold Intolerance, Excessive Hunger, Hair Changes, Heat Intolerance, Hot flashes and New Diabetes. Hematology Not Present- Blood Thinners, Easy Bruising, Excessive bleeding, Gland problems, HIV and Persistent Infections.   Physical Exam (Estefan Pattison A. Brytani Voth MD; 04/04/2017 10:45 AM)  General Mental Status-Alert. General Appearance-Consistent with stated age. Hydration-Well hydrated. Voice-Normal.  Head and Neck Head-normocephalic, atraumatic with no lesions or palpable masses. Trachea-midline. Thyroid Gland Characteristics - normal size and consistency.  Eye Eyeball - Bilateral-Extraocular movements intact. Sclera/Conjunctiva - Bilateral-No scleral icterus.  Chest and Lung Exam Chest and lung exam reveals -quiet, even and easy respiratory effort with no use of accessory muscles and on auscultation, normal breath sounds, no adventitious sounds and normal vocal resonance. Inspection Chest Wall - Normal. Back - normal.  Breast Note: both breast examined. Right br of the nipple complex.  around the superior aspect of the nipple complex. Bruising from biopsy on the right. Left breast is normal.Both breasts are small. No nuchal discharge noted bilaterally.  Cardiovascular Cardiovascular examination reveals -normal heart sounds, regular rate and rhythm with no murmurs and normal pedal pulses bilaterally.  Musculoskeletal Normal Exam - Left-Upper Extremity Strength Normal and Lower Extremity Strength Normal. Normal Exam - Right-Upper Extremity Strength Normal and Lower Extremity Strength Normal.  Lymphatic Head & Neck  General Head & Neck Lymphatics: Bilateral - Description - Normal. Axillary  General Axillary Region: Bilateral - Description - Normal. Tenderness -   Non Tender.    Assessment & Plan (Sly Parlee A. Samari Gorby MD; 04/04/2017 10:47 AM)  BREAST CANCER, RIGHT (C50.911) Impression: discussed surgical options today. Discussed mastectomy and reconstruction versus breast conservation. Discussed potential cosmetic issues with lumpectomy in the same breast on the right. Risks, benefits and alternatives to surgery were discussed. She would like to proceeomy and right axillary sentinel lymph node mapping.my and right axillary sentinel lymph node mapping. Risk of lumpectomy include bleeding, infection, seroma, more surgery, use of seed/wire, wound care, cosmetic deformity and the need for other treatments, death , blood clots, death. Pt agrees to proceed. Risk of sentinel lymph node mapping include bleeding, infection, lymphedema, shoulder pain. stiffness, dye allergy. cosmetic deformity , blood clots, death, need for more surgery. Pt agres to proceed.  Current Plans You are being scheduled for surgery- Our schedulers will call you.  You should hear from our office's scheduling department within 5 working days about the location, date, and time of surgery. We try to make accommodations for patient's preferences in scheduling surgery, but sometimes the OR schedule or the  surgeon's schedule prevents us from making those accommodations.  If you have not heard from our office ([phone]) in 5 working days, call the office and ask for your surgeon's nurse.  If you have other questions about your diagnosis, plan, or surgery, call the office and ask for your surgeon's nurse.  Pt Education - [facility] Breast Cancer Information Given - [facility] "Breast Journey" Package We discussed the staging and pathophysiology of breast cancer. We discussed all of the different options for treatment for breast cancer including surgery, chemotherapy, radiation therapy, Herceptin, and antiestrogen therapy. We discussed a sentinel lymph node biopsy as she does not appear to having lymph node involvement right now. We discussed the performance of that with injection of radioactive tracer and blue dye. We discussed that she would have an incision underneath her axillary hairline. We discussed that there is a bout a 10-20% chance of having a positive node with a sentinel lymph node biopsy and we will await the permanent pathology to make any other first further decisions in terms of her treatment. One of these options might be to return to the operating room to perform an axillary lymph node dissection. We discussed about a 1-2% risk lifetime of chronic shoulder pain as well as lymphedema associated with a sentinel lymph node biopsy. We discussed the options for treatment of the breast cancer which included lumpectomy versus a mastectomy. We discussed the performance of the lumpectomy with a wire placement. We discussed a 10-20% chance of a positive margin requiring reexcision in the operating room. We also discussed that she may need radiation therapy or antiestrogen therapy or both if she undergoes lumpectomy. We discussed the mastectomy and the postoperative care for that as well. We discussed that there is no difference in her survival whether she undergoes lumpectomy with radiation therapy or  antiestrogen therapy versus a mastectomy. There is a slight difference in the local recurrence rate being 3-5% with lumpectomy and about 1% with a mastectomy. We discussed the risks of operation including bleeding, infection, possible reoperation. She understands her further therapy will be based on what her stages at the time of her operation.  Pt Education - ABC (After Breast Cancer) Class Info: discussed with patient and provided information. BREAST CANCER, RIGHT (C50.911) 

## 2017-04-30 NOTE — Progress Notes (Signed)
Assisted Dr. Kalman Shan with right, ultrasound guided, pectoralis blockand nuc med tech # [id number] with nuc med inj. Side rails up, monitors on throughout procedure. See vital signs in flow sheet. Tolerated Procedure well.

## 2017-05-01 ENCOUNTER — Telehealth: Payer: Self-pay | Admitting: *Deleted

## 2017-05-01 ENCOUNTER — Encounter (HOSPITAL_BASED_OUTPATIENT_CLINIC_OR_DEPARTMENT_OTHER): Payer: Self-pay | Admitting: Surgery

## 2017-05-01 NOTE — Telephone Encounter (Signed)
CALLED PATIENT TO INFORM OF [place] APPT. ON 05-22-17 - 10:30 AM - NURSE AND 11:00 AM - DR. MOODY, SPOKE WITH PATIENT AND SHE IS AWARE OF THIS APPT.

## 2017-05-07 ENCOUNTER — Encounter: Payer: Self-pay | Admitting: Hematology and Oncology

## 2017-05-07 ENCOUNTER — Telehealth: Payer: Self-pay | Admitting: *Deleted

## 2017-05-07 ENCOUNTER — Ambulatory Visit (HOSPITAL_BASED_OUTPATIENT_CLINIC_OR_DEPARTMENT_OTHER): Payer: BLUE CROSS/BLUE SHIELD | Admitting: Hematology and Oncology

## 2017-05-07 DIAGNOSIS — Z170 Estrogen receptor positive status [ER+]: Secondary | ICD-10-CM | POA: Diagnosis not present

## 2017-05-07 DIAGNOSIS — C50411 Malignant neoplasm of upper-outer quadrant of right female breast: Secondary | ICD-10-CM | POA: Diagnosis not present

## 2017-05-07 DIAGNOSIS — M79621 Pain in right upper arm: Secondary | ICD-10-CM

## 2017-05-07 NOTE — Telephone Encounter (Signed)
Ordered oncotype per Dr. Lindi Adie. Faxed PA to [facility] and faxed requisition to pathology.

## 2017-05-07 NOTE — Assessment & Plan Note (Signed)
04/30/2017 Right lumpectomy: IDC grade 1, 1.4 cm, DCIS grade 1, margins negative, N0 stage IA, ER 90%, PR 90%, HER-2 negative, Ki-67 15%  Pathology counseling: I discussed the final pathology report of the patient provided  a copy of this report. I discussed the margins as well as lymph node surgeries. We also discussed the final staging along with previously performed ER/PR and HER-2/neu testing.  Treatment plan: 1. Oncotype DX testing to determine if chemotherapy would be of any benefit followed by 2. Adjuvant radiation therapy followed by 3. Adjuvant antiestrogen therapy  Return to clinic based on Oncotype DX testing

## 2017-05-07 NOTE — Progress Notes (Signed)
Patient Care Team: Patient, No Pcp Per as PCP - General (General Practice) Servando Salina, MD as Consulting Physician (Obstetrics and Gynecology) Erroll Luna, MD as Consulting Physician (General Surgery) Nicholas Lose, MD as Consulting Physician (Hematology and Oncology) Kyung Rudd, MD as Consulting Physician (Radiation Oncology) Servando Salina, MD as Consulting Physician (Obstetrics and Gynecology)  DIAGNOSIS:  Encounter Diagnosis  Name Primary?  . Malignant neoplasm of upper-outer quadrant of right breast in female, estrogen receptor positive ([name])     SUMMARY OF ONCOLOGIC HISTORY:[name] will keep   Malignant neoplasm of upper-outer quadrant of right breast in female, estrogen receptor positive ([name])   03/26/2017 Initial Diagnosis    Screening detected right breast breast density 1.4 cm, axilla negative, biopsy: Grade 2 IDC with DCIS ER 90%, PR 90%, HER-2 negative ratio 1.05, Ki-67 15%, T1c N0 stage IA clinical stage      04/30/2017 Surgery    Right lumpectomy: IDC grade 1, 1.4 cm, DCIS grade 1, margins negative, N0 stage IA, ER 90%, PR 90%, HER-2 negative, Ki-67 15%       CHIEF COMPLIANT: Follow-up after recent right lumpectomy  INTERVAL HISTORY: Brandi Lewis is a 51 year old with above-mentioned history of right breast cancer who underwent lumpectomy on 04/30/2017. She is here today accompanied by her husband to discuss the surgery report. She complains of 6 out of 10 pain underneath the axilla. However she is still staying very active and lifting her arm. She thinks that the pain is improving slowly with time. She does not have pain in the breast.  REVIEW OF SYSTEMS:   Constitutional: Denies fevers, chills or abnormal weight loss Eyes: Denies blurriness of vision Ears, nose, mouth, throat, and face: Denies mucositis or sore throat Respiratory: Denies cough, dyspnea or wheezes Cardiovascular: Denies palpitation, chest discomfort Gastrointestinal:   Denies nausea, heartburn or change in bowel habits Skin: Denies abnormal skin rashes Lymphatics: Denies new lymphadenopathy or easy bruising Neurological:Denies numbness, tingling or new weaknesses Behavioral/Psych: Mood is stable, no new changes  Extremities: No lower extremity edema Breast: Right lumpectomy, pain in the right axilla All other systems were reviewed with the patient and are negative.  I have reviewed the past medical history, past surgical history, social history and family history with the patient and they are unchanged from previous note.  ALLERGIES:  is allergic to codeine.  MEDICATIONS:  Current Outpatient Prescriptions  Medication Sig Dispense Refill  . ibuprofen (ADVIL,MOTRIN) 800 MG tablet Take 1 tablet (800 mg total) by mouth every 8 (eight) hours as needed. 30 tablet 0  . OnabotulinumtoxinA (BOTOX IJ) Inject as directed.    . ondansetron (ZOFRAN) 4 MG tablet Take 1 tablet (4 mg total) by mouth every 8 (eight) hours as needed for nausea or vomiting. 20 tablet 0  . oxyCODONE (OXY IR/ROXICODONE) 5 MG immediate release tablet Take 1-2 tablets (5-10 mg total) by mouth every 6 (six) hours as needed for severe pain. 10 tablet 0  . tiZANidine (ZANAFLEX) 4 MG capsule Take 4 mg by mouth 2 (two) times daily as needed for muscle spasms.    Marland Kitchen topiramate (TOPAMAX) 25 MG tablet Take 200 mg by mouth every evening.      No current facility-administered medications for this visit.     PHYSICAL EXAMINATION: ECOG PERFORMANCE STATUS: 1 - Symptomatic but completely ambulatory  Vitals:   05/07/17 0954  BP: 140/80  Pulse: 74  Resp: 18  Temp: 98.4 F (36.9 C)   Filed Weights   05/07/17 0954  Weight:  110 lb (49.9 kg)    GENERAL:alert, no distress and comfortable SKIN: skin color, texture, turgor are normal, no rashes or significant lesions EYES: normal, Conjunctiva are pink and non-injected, sclera clear OROPHARYNX:no exudate, no erythema and lips, buccal mucosa, and  tongue normal  NECK: supple, thyroid normal size, non-tender, without nodularity LYMPH:  no palpable lymphadenopathy in the cervical, axillary or inguinal LUNGS: clear to auscultation and percussion with normal breathing effort HEART: regular rate & rhythm and no murmurs and no lower extremity edema ABDOMEN:abdomen soft, non-tender and normal bowel sounds MUSCULOSKELETAL:no cyanosis of digits and no clubbing  NEURO: alert & oriented x 3 with fluent speech, no focal motor/sensory deficits EXTREMITIES: No lower extremity edema  LABORATORY DATA:  I have reviewed the data as listed   Chemistry      Component Value Date/Time   NA 143 04/04/2017 0856   K 4.1 04/04/2017 0856   CL 112 (H) 09/27/2016 0820   CO2 21 (L) 04/04/2017 0856   BUN 10.2 04/04/2017 0856   CREATININE 1.1 04/04/2017 0856      Component Value Date/Time   CALCIUM 9.5 04/04/2017 0856   ALKPHOS 70 04/04/2017 0856   AST 31 04/04/2017 0856   ALT 34 04/04/2017 0856   BILITOT 0.47 04/04/2017 0856       Lab Results  Component Value Date   WBC 4.4 04/04/2017   HGB 15.1 04/04/2017   HCT 44.5 04/04/2017   MCV 89.2 04/04/2017   PLT 227 04/04/2017   NEUTROABS 1.9 04/04/2017    ASSESSMENT & PLAN:  Malignant neoplasm of upper-outer quadrant of right breast in female, estrogen receptor positive ([name]) 04/30/2017 Right lumpectomy: IDC grade 1, 1.4 cm, DCIS grade 1, margins negative, N0 stage IA, ER 90%, PR 90%, HER-2 negative, Ki-67 15%  Pathology counseling: I discussed the final pathology report of the patient provided  a copy of this report. I discussed the margins as well as lymph node surgeries. We also discussed the final staging along with previously performed ER/PR and HER-2/neu testing.  Treatment plan: 1. Oncotype DX testing to determine if chemotherapy would be of any benefit followed by 2. Adjuvant radiation therapy followed by 3. Adjuvant antiestrogen therapy  Pain in the axilla: Slowly improving with  time Return to clinic based on Oncotype DX testing  I spent 25 minutes talking to the patient of which more than half was spent in counseling and coordination of care.  No orders of the defined types were placed in this encounter.  The patient has a good understanding of the overall plan. she agrees with it. she will call with any problems that may develop before the next visit here.   Rulon Eisenmenger, MD 05/07/17

## 2017-05-14 ENCOUNTER — Telehealth: Payer: Self-pay | Admitting: *Deleted

## 2017-05-14 ENCOUNTER — Encounter (HOSPITAL_COMMUNITY): Payer: Self-pay

## 2017-05-14 NOTE — Telephone Encounter (Signed)
Received oncotype score of 16/10%. Physician team notified.  Left vm for pt to return call to discuss the results.

## 2017-05-14 NOTE — Telephone Encounter (Signed)
Spoke to pt regarding oncotype score of 16 and informed she does not need chemotherapy. Confirmed appt with Dr. Lisbeth Renshaw. Denied further needs at this time.

## 2017-05-15 ENCOUNTER — Encounter: Payer: Self-pay | Admitting: Radiation Oncology

## 2017-05-22 ENCOUNTER — Telehealth: Payer: Self-pay | Admitting: *Deleted

## 2017-05-22 ENCOUNTER — Encounter: Payer: Self-pay | Admitting: Radiation Oncology

## 2017-05-22 ENCOUNTER — Ambulatory Visit
Admission: RE | Admit: 2017-05-22 | Discharge: 2017-05-22 | Disposition: A | Discharge status: Home / Self Care | Payer: BLUE CROSS/BLUE SHIELD | Source: Ambulatory Visit | Attending: Radiation Oncology | Admitting: Radiation Oncology

## 2017-05-22 DIAGNOSIS — C50411 Malignant neoplasm of upper-outer quadrant of right female breast: Secondary | ICD-10-CM | POA: Diagnosis present

## 2017-05-22 DIAGNOSIS — Z79899 Other long term (current) drug therapy: Secondary | ICD-10-CM | POA: Diagnosis not present

## 2017-05-22 DIAGNOSIS — Z170 Estrogen receptor positive status [ER+]: Secondary | ICD-10-CM

## 2017-05-22 DIAGNOSIS — Z510 Encounter for antineoplastic radiation therapy: Secondary | ICD-10-CM | POA: Diagnosis not present

## 2017-05-22 HISTORY — DX: Allergy, unspecified, initial encounter: T78.40XA

## 2017-05-22 HISTORY — DX: Malignant neoplasm of unspecified site of unspecified female breast: C50.919

## 2017-05-22 NOTE — Progress Notes (Signed)
  Radiation Oncology         [phone]) [phone] ________________________________  Name: Brandi Lewis MRN: [id number]  Date: 05/22/2017  DOB: 1966-10-28  Optical Surface Tracking Plan:  Since intensity modulated radiotherapy (IMRT) and 3D conformal radiation treatment methods are predicated on accurate and precise positioning for treatment, intrafraction motion monitoring is medically necessary to ensure accurate and safe treatment delivery.  The ability to quantify intrafraction motion without excessive ionizing radiation dose can only be performed with optical surface tracking. Accordingly, surface imaging offers the opportunity to obtain 3D measurements of patient position throughout IMRT and 3D treatments without excessive radiation exposure.  I am ordering optical surface tracking for this patient's upcoming course of radiotherapy. ________________________________  Kyung Rudd, MD 05/22/2017 5:44 PM    Reference:   Particia Jasper, et al. Surface imaging-based analysis of intrafraction motion for breast radiotherapy patients.Journal of [place], n. 6, nov. 2014. ISSN [id number].   Available at: <http://www.jacmp.org/index.php/jacmp/article/view/4957>.

## 2017-05-22 NOTE — Progress Notes (Signed)
Radiation Oncology         [phone] ________________________________  Name: Brandi Lewis MRN: [id number]  Date: 05/22/2017  DOB: 1966/10/26  JO:ACZYSAY, No Pcp Per  Erroll Luna, MD     REFERRING PHYSICIAN: Erroll Luna, MD   DIAGNOSIS: There were no encounter diagnoses.   HISTORY OF PRESENT ILLNESS: Brandi Lewis is a 51 y.o. female seen originally in the multidisciplinary breast clinic on 04/04/17 for right breast cancer. Patient has a history of right excisional biopsy in the right breast on 03/30/2010, 12/08/2005, and in 1984.  Patient has a history of CSL found on a previous right breast biopsy in 2007. Screening mammography on 03/16/17 revealed an asymmetrical nodular density in the upper right breast approximately 4cm from the nipple. Ultrasound revealed an irregular hypoechoic mass in the right breast at the 11 o'clock axis, 2 cm from the nipple, with internal vascularity, measuring 1.4 x 0.7 x 0.7 cm. The axilla was negative. Biopsy on 03/26/17 showed invasive ductal carcinoma and ductal carcinoma in situ, grade II. Hormone receptor status is ER+ (90%), PR+ (90%), HER-2 (neg) and Ki67 (15%). She underwent right breast lumpectomy with sentinel lymph node biopsy on 04/30/17.  This confirmed invasive ductal carcinoma, grade I, spanning 1.4 cm. All margins were negative. Sentinel lymph node biopsy showed six benign lymph nodes (0/6). Patient underwent oncotype testing on 05/07/17 resulting in a score of 16. She returns today to discuss adjuvant radiotherapy.    PREVIOUS RADIATION THERAPY: No   PAST MEDICAL HISTORY:  Past Medical History:  Diagnosis Date  . Allergy    codeine  . Breast cancer ([place]) 03/26/2017 bx   right breast   . Headache(784.0)    migraine  . Thrombosed hemorrhoids        PAST SURGICAL HISTORY: Past Surgical History:  Procedure Laterality Date  . BREAST BIOPSY  12/08/2005  . BREAST CYST EXCISION  2007   rt breast  . BREAST EXCISIONAL  BIOPSY Right 03/30/2010  . BREAST EXCISIONAL BIOPSY Right 1984  . BREAST LUMPECTOMY WITH RADIOACTIVE SEED AND SENTINEL LYMPH NODE BIOPSY Right 04/30/2017   Procedure: BREAST LUMPECTOMY WITH RADIOACTIVE SEED AND AXILLARY SENTINEL LYMPH NODE BIOPSY;  Surgeon: Erroll Luna, MD;  Location: [facility];  Service: General;  Laterality: Right;  . THYROID SURGERY  11/02/2005   thyroid exploration with isthmusectomy     FAMILY HISTORY:  Family History  Problem Relation Age of Onset  . Hypertension Mother   . Bone cancer Father   . Prostate cancer Father   . Colon cancer Maternal Grandfather      SOCIAL HISTORY:  reports that she has never smoked. She has never used smokeless tobacco. She reports that she does not drink alcohol or use drugs. The patient is married and lives in [place], [place]. 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   ALLERGIES: Codeine   MEDICATIONS:  Current Outpatient Prescriptions  Medication Sig Dispense Refill  . ibuprofen (ADVIL,MOTRIN) 800 MG tablet Take 1 tablet (800 mg total) by mouth every 8 (eight) hours as needed. 30 tablet 0  . OnabotulinumtoxinA (BOTOX IJ) Inject as directed.    Marland Kitchen oxyCODONE (OXY IR/ROXICODONE) 5 MG immediate release tablet Take 1-2 tablets (5-10 mg total) by mouth every 6 (six) hours as needed for severe pain. 10 tablet 0  . tiZANidine (ZANAFLEX) 4 MG capsule Take 4 mg by mouth 2 (two) times daily as needed for muscle spasms.    Marland Kitchen topiramate (TOPAMAX) 25  MG tablet Take 200 mg by mouth every evening.     . ondansetron (ZOFRAN) 4 MG tablet Take 1 tablet (4 mg total) by mouth every 8 (eight) hours as needed for nausea or vomiting. (Patient not taking: Reported on 05/22/2017) 20 tablet 0   No current facility-administered medications for this encounter.      REVIEW OF SYSTEMS: On review of systems, the patient reports that she is doing well overall. She denies lymphedema or pain. She denies any chest pain, shortness of  breath, cough, fevers, chills, night sweats, unintended weight changes. She denies any bowel or bladder disturbances, and denies abdominal pain, nausea or vomiting. She denies any new musculoskeletal or joint aches or pains. A complete review of systems is obtained and is otherwise negative.     PHYSICAL EXAM:  Wt Readings from Last 3 Encounters:  05/22/17 112 lb (50.8 kg)  05/07/17 110 lb (49.9 kg)  04/30/17 109 lb 2 oz (49.5 kg)   Temp Readings from Last 3 Encounters:  05/22/17 97.8 F (36.6 C) (Oral)  05/07/17 98.4 F (36.9 C) (Oral)  04/30/17 97.6 F (36.4 C)   BP Readings from Last 3 Encounters:  05/22/17 102/80  05/07/17 140/80  04/30/17 138/89   Pulse Readings from Last 3 Encounters:  05/22/17 94  05/07/17 74  04/30/17 74   Pain Assessment Pain Score: 0-No pain/10  In general this is a well appearing African American female in no acute distress. She is alert and oriented x4 and appropriate throughout the examination. HEENT reveals that the patient is normocephalic, atraumatic. EOMs are intact. PERRLA. Skin is intact without any evidence of gross lesions. Cardiopulmonary assessment is negative for acute distress and she exhibits normal effort. The right breast reveals a well healing right lumpectomy and axillary incision without signs of drainage or infection, dermabond is present.   ECOG = 0  0 - Asymptomatic (Fully active, able to carry on all predisease activities without restriction)  1 - Symptomatic but completely ambulatory (Restricted in physically strenuous activity but ambulatory and able to carry out work of a light or sedentary nature. For example, light housework, office work)  2 - Symptomatic, <50% in bed during the day (Ambulatory and capable of all self care but unable to carry out any work activities. Up and about more than 50% of waking hours)  3 - Symptomatic, >50% in bed, but not bedbound (Capable of only limited self-care, confined to bed or chair  50% or more of waking hours)  4 - Bedbound (Completely disabled. Cannot carry on any self-care. Totally confined to bed or chair)  5 - Death   Eustace Pen MM, Creech RH, Tormey DC, et al. [phone]). "Toxicity and response criteria of the [employer]". [place] Oncol. 5 (6): 649-55    LABORATORY DATA:  Lab Results  Component Value Date   WBC 4.4 04/04/2017   HGB 15.1 04/04/2017   HCT 44.5 04/04/2017   MCV 89.2 04/04/2017   PLT 227 04/04/2017   Lab Results  Component Value Date   NA 143 04/04/2017   K 4.1 04/04/2017   CL 112 (H) 09/27/2016   CO2 21 (L) 04/04/2017   Lab Results  Component Value Date   ALT 34 04/04/2017   AST 31 04/04/2017   ALKPHOS 70 04/04/2017   BILITOT 0.47 04/04/2017      RADIOGRAPHY: Mm Breast Surgical Specimen  Result Date: 04/30/2017 CLINICAL DATA:  Radioactive seed placement was performed on 04/27/2017 for right breast  cancer. EXAM: SPECIMEN RADIOGRAPH OF THE RIGHT BREAST COMPARISON:  Previous exam(s). FINDINGS: Status post excision of the right breast. The radioactive seed and biopsy marker clip are present, completely intact, and were marked for pathology. The seed not separated from the surgical specimen, but is included in the images. IMPRESSION: Specimen radiograph of the right breast. Electronically Signed   By: Curlene Dolphin M.D.   On: 04/30/2017 11:49   Mm Rt Radioactive Seed Loc Mammo Guide  Result Date: 04/27/2017 CLINICAL DATA:  51 year old female presenting for radioactive seed localization prior to right breast lumpectomy. EXAM: MAMMOGRAPHIC GUIDED RADIOACTIVE SEED LOCALIZATION OF THE RIGHT BREAST COMPARISON:  Previous exam(s). FINDINGS: Patient presents for radioactive seed localization prior to lumpectomy of the right breast. I met with the patient and we discussed the procedure of seed localization including benefits and alternatives. We discussed the high likelihood of a successful procedure. We discussed the risks of  the procedure including infection, bleeding, tissue injury and further surgery. We discussed the low dose of radioactivity involved in the procedure. Informed, written consent was given. The usual time-out protocol was performed immediately prior to the procedure. Using mammographic guidance, sterile technique, 1% lidocaine and an I-125 radioactive seed, the clip within the mass in the upper-outer quadrant of the right breast was localized using a superior approach. The follow-up mammogram images confirm the seed in the expected location and were marked for Dr. Brantley Stage. Follow-up survey of the patient confirms presence of the radioactive seed. Order number of I-125 seed:  [id number]. Total activity:  6.606 millicuries  Reference Date: 04/02/2017 The patient tolerated the procedure well and was released from the [facility]. She was given instructions regarding seed removal. IMPRESSION: Radioactive seed localization right breast. No apparent complications. Electronically Signed   By: Ammie Ferrier M.D.   On: 04/27/2017 14:30       IMPRESSION/PLAN: 1. Dr. Lisbeth Renshaw discusses the pathology findings and reviews the nature of invasive disease. The consensus from the breast conference include breast conservation. The patient is currently 3 weeks post-op from right lumpectomy with sentinel lymph node biopsy. Given that chemotherapy is not indicated, the patient will undergo external radiotherapy to the breast followed by antiestrogen therapy. We discussed the risks, benefits, short, and long term effects of radiotherapy, and the patient is interested in proceeding. Dr. Lisbeth Renshaw discusses the delivery and logistics of radiotherapy and recommends a course of 6 1/2 weeks of tangent therapy. Patient will be scheduled for CT simulation and treatment planning today at 12:30 pm.   In a visit lasting 25 minutes, greater than 50% of the time was spent face to face discussing her diagnosis, and coordinating the patient's  care.   The above documentation reflects my direct findings during this shared patient visit. Please see the separate note by Dr. Lisbeth Renshaw on this date for the remainder of the patient's plan of care.    Carola Rhine, PAC  This document serves as a record of services personally performed by Kyung Rudd, MD, and Shona Simpson, PA-C. It was created on their behalf by Bethann Humble, a trained medical scribe. The creation of this record is based on the scribe's personal observations and the provider's statements to them. This document has been checked and approved by the attending provider.

## 2017-05-22 NOTE — Progress Notes (Signed)
  Radiation Oncology         [phone] ________________________________  Name: Brandi Lewis MRN: [id number]  Date: 05/22/2017  DOB: May 28, 1966  DIAGNOSIS:     ICD-10-CM   1. Malignant neoplasm of upper-outer quadrant of right breast in female, estrogen receptor positive ([place]) C50.411    Z17.0      SIMULATION AND TREATMENT PLANNING NOTE  The patient presented for simulation prior to beginning her course of radiation treatment for her diagnosis of right-sided breast cancer. The patient was placed in a supine position on a breast board. A customized vac-lock bag was constructed and this complex treatment device will be used on a daily basis during her treatment. In this fashion, a CT scan was obtained through the chest area and an isocenter was placed near the chest wall within the breast.  The patient will be planned to receive a course of radiation initially to a dose of 50.4 Gy. This will consist of a whole breast radiotherapy technique. To accomplish this, 2 customized blocks have been designed which will correspond to medial and lateral whole breast tangent fields. This treatment will be accomplished at 1.8 Gy per fraction. A forward planning technique will also be evaluated to determine if this approach improves the plan. It is anticipated that the patient will then receive a 10 Gy boost to the seroma cavity which has been contoured. This will be accomplished at 2 Gy per fraction.   This initial treatment will consist of a 3-D conformal technique. The seroma has been contoured as the primary target structure. Additionally, dose volume histograms of both this target as well as the lungs and heart will also be evaluated. Such an approach is necessary to ensure that the target area is adequately covered while the nearby critical  normal structures are adequately spared.  Plan:  The final anticipated total dose therefore will correspond to 60.4  Gy.    _______________________________   Jodelle Gross, MD, PhD

## 2017-05-22 NOTE — Telephone Encounter (Signed)
Left vm on patient phone, appt for 1030am, if running late or traffic disregard this message, if cannot make appt please call and reschedule

## 2017-05-23 ENCOUNTER — Other Ambulatory Visit: Payer: Self-pay | Admitting: Obstetrics and Gynecology

## 2017-05-23 DIAGNOSIS — Z510 Encounter for antineoplastic radiation therapy: Secondary | ICD-10-CM | POA: Diagnosis not present

## 2017-05-24 ENCOUNTER — Other Ambulatory Visit: Payer: Self-pay | Admitting: Gastroenterology

## 2017-05-24 DIAGNOSIS — R1032 Left lower quadrant pain: Secondary | ICD-10-CM

## 2017-05-24 NOTE — Progress Notes (Signed)
Trajon Rosete MD 

## 2017-05-25 ENCOUNTER — Encounter: Payer: Self-pay | Admitting: Radiation Oncology

## 2017-05-25 ENCOUNTER — Ambulatory Visit
Admission: RE | Admit: 2017-05-25 | Discharge: 2017-05-25 | Disposition: A | Discharge status: Home / Self Care | Payer: BLUE CROSS/BLUE SHIELD | Source: Ambulatory Visit | Attending: Gastroenterology | Admitting: Gastroenterology

## 2017-05-25 DIAGNOSIS — R1032 Left lower quadrant pain: Secondary | ICD-10-CM

## 2017-05-25 MED ORDER — IOPAMIDOL (ISOVUE-300) INJECTION 61%
100.0000 mL | Freq: Once | INTRAVENOUS | Status: AC | PRN
  Administered 2017-05-25: 100 mL via INTRAVENOUS

## 2017-05-25 NOTE — Progress Notes (Signed)
This is an addendum to my consult note on 05/22/17. The patient is not on oral contraception and reports that she hasn't had a menstural cycle in years because of piggybacking her OCPs. She has been off OCPs for about 1.5 months and has not had a cycle. I encouraged her to discuss this with her OB/GYN or medical oncologist, as we reviewed that she should not become pregnant during radiotherapy due to potential toxicities to developing fetuses. She is in agreement and until she discusses this further agrees to barrier methods of contraception. Her husband was present and in agreement during this conversation as well.     Carola Rhine, PAC

## 2017-05-26 ENCOUNTER — Telehealth: Payer: Self-pay | Admitting: Hematology and Oncology

## 2017-05-26 NOTE — Telephone Encounter (Signed)
sw pt to confirm 9/4 appt at 0830 per sch msg

## 2017-05-30 ENCOUNTER — Ambulatory Visit
Admission: RE | Admit: 2017-05-30 | Discharge: 2017-05-30 | Disposition: A | Discharge status: Home / Self Care | Payer: BLUE CROSS/BLUE SHIELD | Source: Ambulatory Visit | Attending: Radiation Oncology | Admitting: Radiation Oncology

## 2017-05-30 DIAGNOSIS — Z510 Encounter for antineoplastic radiation therapy: Secondary | ICD-10-CM | POA: Diagnosis not present

## 2017-05-31 ENCOUNTER — Ambulatory Visit
Admission: RE | Admit: 2017-05-31 | Discharge: 2017-05-31 | Disposition: A | Discharge status: Home / Self Care | Payer: BLUE CROSS/BLUE SHIELD | Source: Ambulatory Visit | Attending: Radiation Oncology | Admitting: Radiation Oncology

## 2017-05-31 DIAGNOSIS — Z510 Encounter for antineoplastic radiation therapy: Secondary | ICD-10-CM | POA: Diagnosis not present

## 2017-05-31 DIAGNOSIS — Z170 Estrogen receptor positive status [ER+]: Secondary | ICD-10-CM

## 2017-05-31 DIAGNOSIS — C50411 Malignant neoplasm of upper-outer quadrant of right female breast: Secondary | ICD-10-CM

## 2017-05-31 MED ORDER — ALRA NON-METALLIC DEODORANT (RAD-ONC)
1.0000 "application " | Freq: Once | TOPICAL | Status: AC
  Administered 2017-05-31: 1 via TOPICAL

## 2017-05-31 MED ORDER — RADIAPLEXRX EX GEL
Freq: Once | CUTANEOUS | Status: AC
  Administered 2017-05-31: 12:00:00 via TOPICAL

## 2017-05-31 NOTE — Progress Notes (Signed)
Pt education done, my business card radiaplex gel, alra deodorant, Radiatin therapy and you book, discussed ways to manage side effects, fatigue, pain, increase protein in diet, stay hydrated, increase water intake, , radiaplex gel and alra apply after rad txs daily and bedtime, alra prn after rad txs, luke warm bath/showers, no rubbing,scrubbing or scratching breast area, pat dry, electric shaver only, no under wire bras if possibel, sees MD wekly and prn, teach back given 11:41 AM

## 2017-06-01 ENCOUNTER — Ambulatory Visit
Admission: RE | Admit: 2017-06-01 | Discharge: 2017-06-01 | Disposition: A | Discharge status: Home / Self Care | Payer: BLUE CROSS/BLUE SHIELD | Source: Ambulatory Visit | Attending: Radiation Oncology | Admitting: Radiation Oncology

## 2017-06-01 DIAGNOSIS — Z510 Encounter for antineoplastic radiation therapy: Secondary | ICD-10-CM | POA: Diagnosis not present

## 2017-06-04 ENCOUNTER — Ambulatory Visit
Admission: RE | Admit: 2017-06-04 | Discharge: 2017-06-04 | Disposition: A | Discharge status: Home / Self Care | Payer: BLUE CROSS/BLUE SHIELD | Source: Ambulatory Visit | Attending: Radiation Oncology | Admitting: Radiation Oncology

## 2017-06-04 DIAGNOSIS — Z510 Encounter for antineoplastic radiation therapy: Secondary | ICD-10-CM | POA: Diagnosis not present

## 2017-06-05 ENCOUNTER — Ambulatory Visit
Admission: RE | Admit: 2017-06-05 | Discharge: 2017-06-05 | Disposition: A | Discharge status: Home / Self Care | Payer: BLUE CROSS/BLUE SHIELD | Source: Ambulatory Visit | Attending: Radiation Oncology | Admitting: Radiation Oncology

## 2017-06-05 DIAGNOSIS — Z510 Encounter for antineoplastic radiation therapy: Secondary | ICD-10-CM | POA: Diagnosis not present

## 2017-06-06 ENCOUNTER — Ambulatory Visit
Admission: RE | Admit: 2017-06-06 | Discharge: 2017-06-06 | Disposition: A | Discharge status: Home / Self Care | Payer: BLUE CROSS/BLUE SHIELD | Source: Ambulatory Visit | Attending: Radiation Oncology | Admitting: Radiation Oncology

## 2017-06-06 DIAGNOSIS — Z510 Encounter for antineoplastic radiation therapy: Secondary | ICD-10-CM | POA: Diagnosis not present

## 2017-06-07 ENCOUNTER — Ambulatory Visit
Admission: RE | Admit: 2017-06-07 | Discharge: 2017-06-07 | Disposition: A | Discharge status: Home / Self Care | Payer: BLUE CROSS/BLUE SHIELD | Source: Ambulatory Visit | Attending: Radiation Oncology | Admitting: Radiation Oncology

## 2017-06-07 DIAGNOSIS — Z510 Encounter for antineoplastic radiation therapy: Secondary | ICD-10-CM | POA: Diagnosis not present

## 2017-06-08 ENCOUNTER — Ambulatory Visit
Admission: RE | Admit: 2017-06-08 | Discharge: 2017-06-08 | Disposition: A | Discharge status: Home / Self Care | Payer: BLUE CROSS/BLUE SHIELD | Source: Ambulatory Visit | Attending: Radiation Oncology | Admitting: Radiation Oncology

## 2017-06-08 DIAGNOSIS — Z510 Encounter for antineoplastic radiation therapy: Secondary | ICD-10-CM | POA: Diagnosis not present

## 2017-06-11 ENCOUNTER — Ambulatory Visit
Admission: RE | Admit: 2017-06-11 | Discharge: 2017-06-11 | Disposition: A | Discharge status: Home / Self Care | Payer: BLUE CROSS/BLUE SHIELD | Source: Ambulatory Visit | Attending: Radiation Oncology | Admitting: Radiation Oncology

## 2017-06-11 DIAGNOSIS — Z510 Encounter for antineoplastic radiation therapy: Secondary | ICD-10-CM | POA: Diagnosis not present

## 2017-06-12 ENCOUNTER — Ambulatory Visit
Admission: RE | Admit: 2017-06-12 | Discharge: 2017-06-12 | Disposition: A | Discharge status: Home / Self Care | Payer: BLUE CROSS/BLUE SHIELD | Source: Ambulatory Visit | Attending: Radiation Oncology | Admitting: Radiation Oncology

## 2017-06-12 DIAGNOSIS — Z510 Encounter for antineoplastic radiation therapy: Secondary | ICD-10-CM | POA: Diagnosis not present

## 2017-06-13 ENCOUNTER — Ambulatory Visit
Admission: RE | Admit: 2017-06-13 | Discharge: 2017-06-13 | Disposition: A | Discharge status: Home / Self Care | Payer: BLUE CROSS/BLUE SHIELD | Source: Ambulatory Visit | Attending: Radiation Oncology | Admitting: Radiation Oncology

## 2017-06-13 DIAGNOSIS — Z510 Encounter for antineoplastic radiation therapy: Secondary | ICD-10-CM | POA: Diagnosis not present

## 2017-06-14 ENCOUNTER — Ambulatory Visit
Admission: RE | Admit: 2017-06-14 | Discharge: 2017-06-14 | Disposition: A | Discharge status: Home / Self Care | Payer: BLUE CROSS/BLUE SHIELD | Source: Ambulatory Visit | Attending: Radiation Oncology | Admitting: Radiation Oncology

## 2017-06-14 DIAGNOSIS — Z510 Encounter for antineoplastic radiation therapy: Secondary | ICD-10-CM | POA: Diagnosis not present

## 2017-06-15 ENCOUNTER — Ambulatory Visit
Admission: RE | Admit: 2017-06-15 | Discharge: 2017-06-15 | Disposition: A | Discharge status: Home / Self Care | Payer: BLUE CROSS/BLUE SHIELD | Source: Ambulatory Visit | Attending: Radiation Oncology | Admitting: Radiation Oncology

## 2017-06-15 DIAGNOSIS — Z510 Encounter for antineoplastic radiation therapy: Secondary | ICD-10-CM | POA: Diagnosis not present

## 2017-06-18 ENCOUNTER — Ambulatory Visit
Admission: RE | Admit: 2017-06-18 | Discharge: 2017-06-18 | Disposition: A | Discharge status: Home / Self Care | Payer: BLUE CROSS/BLUE SHIELD | Source: Ambulatory Visit | Attending: Radiation Oncology | Admitting: Radiation Oncology

## 2017-06-18 DIAGNOSIS — Z510 Encounter for antineoplastic radiation therapy: Secondary | ICD-10-CM | POA: Diagnosis not present

## 2017-06-19 ENCOUNTER — Ambulatory Visit
Admission: RE | Admit: 2017-06-19 | Discharge: 2017-06-19 | Disposition: A | Discharge status: Home / Self Care | Payer: BLUE CROSS/BLUE SHIELD | Source: Ambulatory Visit | Attending: Radiation Oncology | Admitting: Radiation Oncology

## 2017-06-19 DIAGNOSIS — Z510 Encounter for antineoplastic radiation therapy: Secondary | ICD-10-CM | POA: Diagnosis not present

## 2017-06-20 ENCOUNTER — Ambulatory Visit
Admission: RE | Admit: 2017-06-20 | Discharge: 2017-06-20 | Disposition: A | Discharge status: Home / Self Care | Payer: BLUE CROSS/BLUE SHIELD | Source: Ambulatory Visit | Attending: Radiation Oncology | Admitting: Radiation Oncology

## 2017-06-20 DIAGNOSIS — Z510 Encounter for antineoplastic radiation therapy: Secondary | ICD-10-CM | POA: Diagnosis not present

## 2017-06-21 ENCOUNTER — Ambulatory Visit
Admission: RE | Admit: 2017-06-21 | Discharge: 2017-06-21 | Disposition: A | Discharge status: Home / Self Care | Payer: BLUE CROSS/BLUE SHIELD | Source: Ambulatory Visit | Attending: Radiation Oncology | Admitting: Radiation Oncology

## 2017-06-21 DIAGNOSIS — Z510 Encounter for antineoplastic radiation therapy: Secondary | ICD-10-CM | POA: Diagnosis not present

## 2017-06-22 ENCOUNTER — Ambulatory Visit: Payer: BLUE CROSS/BLUE SHIELD

## 2017-06-25 ENCOUNTER — Ambulatory Visit
Admission: RE | Admit: 2017-06-25 | Discharge: 2017-06-25 | Disposition: A | Discharge status: Home / Self Care | Payer: BLUE CROSS/BLUE SHIELD | Source: Ambulatory Visit | Attending: Radiation Oncology | Admitting: Radiation Oncology

## 2017-06-25 DIAGNOSIS — Z510 Encounter for antineoplastic radiation therapy: Secondary | ICD-10-CM | POA: Diagnosis not present

## 2017-06-26 ENCOUNTER — Ambulatory Visit
Admission: RE | Admit: 2017-06-26 | Discharge: 2017-06-26 | Disposition: A | Discharge status: Home / Self Care | Payer: BLUE CROSS/BLUE SHIELD | Source: Ambulatory Visit | Attending: Radiation Oncology | Admitting: Radiation Oncology

## 2017-06-26 DIAGNOSIS — Z510 Encounter for antineoplastic radiation therapy: Secondary | ICD-10-CM | POA: Diagnosis not present

## 2017-06-27 ENCOUNTER — Ambulatory Visit
Admission: RE | Admit: 2017-06-27 | Discharge: 2017-06-27 | Disposition: A | Discharge status: Home / Self Care | Payer: BLUE CROSS/BLUE SHIELD | Source: Ambulatory Visit | Attending: Radiation Oncology | Admitting: Radiation Oncology

## 2017-06-27 DIAGNOSIS — Z510 Encounter for antineoplastic radiation therapy: Secondary | ICD-10-CM | POA: Diagnosis not present

## 2017-06-28 ENCOUNTER — Ambulatory Visit
Admission: RE | Admit: 2017-06-28 | Discharge: 2017-06-28 | Disposition: A | Discharge status: Home / Self Care | Payer: BLUE CROSS/BLUE SHIELD | Source: Ambulatory Visit | Attending: Radiation Oncology | Admitting: Radiation Oncology

## 2017-06-28 DIAGNOSIS — Z510 Encounter for antineoplastic radiation therapy: Secondary | ICD-10-CM | POA: Diagnosis not present

## 2017-06-29 ENCOUNTER — Ambulatory Visit
Admission: RE | Admit: 2017-06-29 | Discharge: 2017-06-29 | Disposition: A | Discharge status: Home / Self Care | Payer: BLUE CROSS/BLUE SHIELD | Source: Ambulatory Visit | Attending: Radiation Oncology | Admitting: Radiation Oncology

## 2017-06-29 DIAGNOSIS — Z510 Encounter for antineoplastic radiation therapy: Secondary | ICD-10-CM | POA: Diagnosis not present

## 2017-07-02 ENCOUNTER — Ambulatory Visit
Admission: RE | Admit: 2017-07-02 | Discharge: 2017-07-02 | Disposition: A | Discharge status: Home / Self Care | Payer: BLUE CROSS/BLUE SHIELD | Source: Ambulatory Visit | Attending: Radiation Oncology | Admitting: Radiation Oncology

## 2017-07-02 DIAGNOSIS — Z510 Encounter for antineoplastic radiation therapy: Secondary | ICD-10-CM | POA: Diagnosis not present

## 2017-07-03 ENCOUNTER — Ambulatory Visit
Admission: RE | Admit: 2017-07-03 | Discharge: 2017-07-03 | Disposition: A | Discharge status: Home / Self Care | Payer: BLUE CROSS/BLUE SHIELD | Source: Ambulatory Visit | Attending: Radiation Oncology | Admitting: Radiation Oncology

## 2017-07-03 DIAGNOSIS — Z510 Encounter for antineoplastic radiation therapy: Secondary | ICD-10-CM | POA: Diagnosis present

## 2017-07-03 DIAGNOSIS — C50411 Malignant neoplasm of upper-outer quadrant of right female breast: Secondary | ICD-10-CM | POA: Insufficient documentation

## 2017-07-04 ENCOUNTER — Ambulatory Visit
Admission: RE | Admit: 2017-07-04 | Discharge: 2017-07-04 | Disposition: A | Discharge status: Home / Self Care | Payer: BLUE CROSS/BLUE SHIELD | Source: Ambulatory Visit | Attending: Radiation Oncology | Admitting: Radiation Oncology

## 2017-07-04 DIAGNOSIS — Z510 Encounter for antineoplastic radiation therapy: Secondary | ICD-10-CM | POA: Diagnosis not present

## 2017-07-05 ENCOUNTER — Ambulatory Visit
Admission: RE | Admit: 2017-07-05 | Discharge: 2017-07-05 | Disposition: A | Discharge status: Home / Self Care | Payer: BLUE CROSS/BLUE SHIELD | Source: Ambulatory Visit | Attending: Radiation Oncology | Admitting: Radiation Oncology

## 2017-07-05 ENCOUNTER — Ambulatory Visit: Payer: BLUE CROSS/BLUE SHIELD | Admitting: Radiation Oncology

## 2017-07-05 DIAGNOSIS — Z510 Encounter for antineoplastic radiation therapy: Secondary | ICD-10-CM | POA: Diagnosis not present

## 2017-07-06 ENCOUNTER — Ambulatory Visit
Admission: RE | Admit: 2017-07-06 | Discharge: 2017-07-06 | Disposition: A | Discharge status: Home / Self Care | Payer: BLUE CROSS/BLUE SHIELD | Source: Ambulatory Visit | Attending: Radiation Oncology | Admitting: Radiation Oncology

## 2017-07-06 DIAGNOSIS — Z510 Encounter for antineoplastic radiation therapy: Secondary | ICD-10-CM | POA: Diagnosis not present

## 2017-07-09 ENCOUNTER — Ambulatory Visit
Admission: RE | Admit: 2017-07-09 | Discharge: 2017-07-09 | Disposition: A | Discharge status: Home / Self Care | Payer: BLUE CROSS/BLUE SHIELD | Source: Ambulatory Visit | Attending: Radiation Oncology | Admitting: Radiation Oncology

## 2017-07-09 DIAGNOSIS — Z510 Encounter for antineoplastic radiation therapy: Secondary | ICD-10-CM | POA: Diagnosis not present

## 2017-07-10 ENCOUNTER — Ambulatory Visit
Admission: RE | Admit: 2017-07-10 | Discharge: 2017-07-10 | Disposition: A | Discharge status: Home / Self Care | Payer: BLUE CROSS/BLUE SHIELD | Source: Ambulatory Visit | Attending: Radiation Oncology | Admitting: Radiation Oncology

## 2017-07-10 ENCOUNTER — Ambulatory Visit: Payer: BLUE CROSS/BLUE SHIELD

## 2017-07-10 DIAGNOSIS — Z510 Encounter for antineoplastic radiation therapy: Secondary | ICD-10-CM | POA: Diagnosis not present

## 2017-07-11 ENCOUNTER — Ambulatory Visit
Admission: RE | Admit: 2017-07-11 | Discharge: 2017-07-11 | Disposition: A | Discharge status: Home / Self Care | Payer: BLUE CROSS/BLUE SHIELD | Source: Ambulatory Visit | Attending: Radiation Oncology | Admitting: Radiation Oncology

## 2017-07-11 ENCOUNTER — Ambulatory Visit: Payer: BLUE CROSS/BLUE SHIELD

## 2017-07-11 DIAGNOSIS — Z510 Encounter for antineoplastic radiation therapy: Secondary | ICD-10-CM | POA: Diagnosis not present

## 2017-07-12 ENCOUNTER — Ambulatory Visit: Payer: BLUE CROSS/BLUE SHIELD

## 2017-07-12 ENCOUNTER — Ambulatory Visit
Admission: RE | Admit: 2017-07-12 | Discharge: 2017-07-12 | Disposition: A | Discharge status: Home / Self Care | Payer: BLUE CROSS/BLUE SHIELD | Source: Ambulatory Visit | Attending: Radiation Oncology | Admitting: Radiation Oncology

## 2017-07-12 DIAGNOSIS — Z510 Encounter for antineoplastic radiation therapy: Secondary | ICD-10-CM | POA: Diagnosis not present

## 2017-07-13 ENCOUNTER — Ambulatory Visit
Admission: RE | Admit: 2017-07-13 | Discharge: 2017-07-13 | Disposition: A | Discharge status: Home / Self Care | Payer: BLUE CROSS/BLUE SHIELD | Source: Ambulatory Visit | Attending: Radiation Oncology | Admitting: Radiation Oncology

## 2017-07-13 DIAGNOSIS — Z510 Encounter for antineoplastic radiation therapy: Secondary | ICD-10-CM | POA: Diagnosis not present

## 2017-07-17 ENCOUNTER — Ambulatory Visit
Admission: RE | Admit: 2017-07-17 | Discharge: 2017-07-17 | Disposition: A | Discharge status: Home / Self Care | Payer: BLUE CROSS/BLUE SHIELD | Source: Ambulatory Visit | Attending: Radiation Oncology | Admitting: Radiation Oncology

## 2017-07-17 ENCOUNTER — Ambulatory Visit: Payer: BLUE CROSS/BLUE SHIELD

## 2017-07-17 ENCOUNTER — Ambulatory Visit (HOSPITAL_BASED_OUTPATIENT_CLINIC_OR_DEPARTMENT_OTHER): Payer: BLUE CROSS/BLUE SHIELD | Admitting: Hematology and Oncology

## 2017-07-17 ENCOUNTER — Encounter: Payer: Self-pay | Admitting: Hematology and Oncology

## 2017-07-17 DIAGNOSIS — Z510 Encounter for antineoplastic radiation therapy: Secondary | ICD-10-CM | POA: Diagnosis not present

## 2017-07-17 DIAGNOSIS — Z170 Estrogen receptor positive status [ER+]: Secondary | ICD-10-CM

## 2017-07-17 DIAGNOSIS — L598 Other specified disorders of the skin and subcutaneous tissue related to radiation: Secondary | ICD-10-CM | POA: Diagnosis not present

## 2017-07-17 DIAGNOSIS — C50411 Malignant neoplasm of upper-outer quadrant of right female breast: Secondary | ICD-10-CM | POA: Diagnosis not present

## 2017-07-17 MED ORDER — TAMOXIFEN CITRATE 20 MG PO TABS: 20.0000 mg | ORAL_TABLET | Freq: Every day | ORAL | 3 refills | Status: DC

## 2017-07-17 NOTE — Assessment & Plan Note (Signed)
04/30/2017 Right lumpectomy: IDC grade 1, 1.4 cm, DCIS grade 1, margins negative, N0 stage IA, ER 90%, PR 90%, HER-2 negative, Ki-67 15% Oncotype DX score 16, low risk, 10% ROR Adjuvant radiation 05/31/2017 completed 07/12/2017  Treatment plan: Adjuvant antiestrogen therapy Return to clinic in 3 months for survivorship care plan was and toxicity check on antiestrogen therapy

## 2017-07-17 NOTE — Progress Notes (Signed)
Patient Care Team: Patient, No Pcp Per as PCP - General (General Practice) Servando Salina, MD as Consulting Physician (Obstetrics and Gynecology) Erroll Luna, MD as Consulting Physician (General Surgery) Nicholas Lose, MD as Consulting Physician (Hematology and Oncology) Kyung Rudd, MD as Consulting Physician (Radiation Oncology) Servando Salina, MD as Consulting Physician (Obstetrics and Gynecology)  DIAGNOSIS:  Encounter Diagnosis  Name Primary?  . Malignant neoplasm of upper-outer quadrant of right breast in female, estrogen receptor positive ([place])     SUMMARY OF ONCOLOGIC HISTORY:   Malignant neoplasm of upper-outer quadrant of right breast in female, estrogen receptor positive ([place])   03/26/2017 Initial Diagnosis    Screening detected right breast breast density 1.4 cm, axilla negative, biopsy: Grade 2 IDC with DCIS ER 90%, PR 90%, HER-2 negative ratio 1.05, Ki-67 15%, T1c N0 stage IA clinical stage      04/30/2017 Surgery    Right lumpectomy: IDC grade 1, 1.4 cm, DCIS grade 1, margins negative, N0 stage IA, ER 90%, PR 90%, HER-2 negative, Ki-67 15%      05/04/2017 Oncotype testing    Oncotype score 16, low risk, 10% ROR      05/31/2017 - 07/13/2017 Radiation Therapy    Adjuvant radiation therapy       CHIEF COMPLIANT: Follow-up after radiation therapy  INTERVAL HISTORY: Brandi Lewis is a 51 year old with above-mentioned history right breast cancer treated with lumpectomy radiation and is here to discuss antiestrogen therapy. She is sore from the effects of radiation. She does have radiation dermatitis.  REVIEW OF SYSTEMS:   Constitutional: Denies fevers, chills or abnormal weight loss Eyes: Denies blurriness of vision Ears, nose, mouth, throat, and face: Denies mucositis or sore throat Respiratory: Denies cough, dyspnea or wheezes Cardiovascular: Denies palpitation, chest discomfort Gastrointestinal:  Denies nausea, heartburn or change in bowel  habits Skin: Denies abnormal skin rashes Lymphatics: Denies new lymphadenopathy or easy bruising Neurological:Denies numbness, tingling or new weaknesses Behavioral/Psych: Mood is stable, no new changes  Extremities: No lower extremity edema Breast: Radiation dermatitis All other systems were reviewed with the patient and are negative.  I have reviewed the past medical history, past surgical history, social history and family history with the patient and they are unchanged from previous note.  ALLERGIES:  is allergic to codeine.  MEDICATIONS:  Current Outpatient Prescriptions  Medication Sig Dispense Refill  . hyaluronate sodium (RADIAPLEXRX) GEL Apply 1 application topically 2 (two) times daily.    Marland Kitchen ibuprofen (ADVIL,MOTRIN) 800 MG tablet Take 1 tablet (800 mg total) by mouth every 8 (eight) hours as needed. 30 tablet 0  . non-metallic deodorant (ALRA) MISC Apply 1 application topically.    . OnabotulinumtoxinA (BOTOX IJ) Inject as directed.    . ondansetron (ZOFRAN) 4 MG tablet Take 1 tablet (4 mg total) by mouth every 8 (eight) hours as needed for nausea or vomiting. (Patient not taking: Reported on 05/22/2017) 20 tablet 0  . tamoxifen (NOLVADEX) 20 MG tablet Take 1 tablet (20 mg total) by mouth daily. 90 tablet 3  . tiZANidine (ZANAFLEX) 4 MG capsule Take 4 mg by mouth 2 (two) times daily as needed for muscle spasms.    Marland Kitchen topiramate (TOPAMAX) 25 MG tablet Take 200 mg by mouth every evening.      No current facility-administered medications for this visit.     PHYSICAL EXAMINATION: ECOG PERFORMANCE STATUS: 1 - Symptomatic but completely ambulatory  Vitals:   07/17/17 0853  BP: 117/70  Pulse: 81  Resp: 20  Temp:  97.7 F (36.5 C)  SpO2: 100%   Filed Weights   07/17/17 0853  Weight: 113 lb 1.6 oz (51.3 kg)    GENERAL:alert, no distress and comfortable SKIN: skin color, texture, turgor are normal, no rashes or significant lesions EYES: normal, Conjunctiva are pink and  non-injected, sclera clear OROPHARYNX:no exudate, no erythema and lips, buccal mucosa, and tongue normal  NECK: supple, thyroid normal size, non-tender, without nodularity LYMPH:  no palpable lymphadenopathy in the cervical, axillary or inguinal LUNGS: clear to auscultation and percussion with normal breathing effort HEART: regular rate & rhythm and no murmurs and no lower extremity edema ABDOMEN:abdomen soft, non-tender and normal bowel sounds MUSCULOSKELETAL:no cyanosis of digits and no clubbing  NEURO: alert & oriented x 3 with fluent speech, no focal motor/sensory deficits EXTREMITIES: No lower extremity edema  LABORATORY DATA:  I have reviewed the data as listed   Chemistry      Component Value Date/Time   NA 143 04/04/2017 0856   K 4.1 04/04/2017 0856   CL 112 (H) 09/27/2016 0820   CO2 21 (L) 04/04/2017 0856   BUN 10.2 04/04/2017 0856   CREATININE 1.1 04/04/2017 0856      Component Value Date/Time   CALCIUM 9.5 04/04/2017 0856   ALKPHOS 70 04/04/2017 0856   AST 31 04/04/2017 0856   ALT 34 04/04/2017 0856   BILITOT 0.47 04/04/2017 0856       Lab Results  Component Value Date   WBC 4.4 04/04/2017   HGB 15.1 04/04/2017   HCT 44.5 04/04/2017   MCV 89.2 04/04/2017   PLT 227 04/04/2017   NEUTROABS 1.9 04/04/2017    ASSESSMENT & PLAN:  Malignant neoplasm of upper-outer quadrant of right breast in female, estrogen receptor positive ([place]) 04/30/2017 Right lumpectomy: IDC grade 1, 1.4 cm, DCIS grade 1, margins negative, N0 stage IA, ER 90%, PR 90%, HER-2 negative, Ki-67 15% Oncotype DX score 16, low risk, 10% ROR Adjuvant radiation 05/31/2017 completed 07/12/2017  Treatment plan: Adjuvant antiestrogen therapy With tamoxifen 20 mg daily because her gynecologist perform blood work which showed that she was still perimenopausal. Our plan is to switch her to anastrozole 1 she is postmenopausal.  We discussed the risks and benefits of tamoxifen. These include but not limited  to insomnia, hot flashes, mood changes, vaginal dryness, and weight gain. Although rare, serious side effects including endometrial cancer, risk of blood clots were also discussed. We strongly believe that the benefits far outweigh the risks. Patient understands these risks and consented to starting treatment. Planned treatment duration is 5-7 years.  She was inquiring about birth control options. I discussed with her about copper IUD or Mirena.  Return to clinic in 3 months for survivorship care plan was and toxicity check on antiestrogen therapy   I spent 25 minutes talking to the patient of which more than half was spent in counseling and coordination of care.  No orders of the defined types were placed in this encounter.  The patient has a good understanding of the overall plan. she agrees with it. she will call with any problems that may develop before the next visit here.   Rulon Eisenmenger, MD 07/17/17

## 2017-07-18 ENCOUNTER — Ambulatory Visit
Admission: RE | Admit: 2017-07-18 | Discharge: 2017-07-18 | Disposition: A | Discharge status: Home / Self Care | Payer: BLUE CROSS/BLUE SHIELD | Source: Ambulatory Visit | Attending: Radiation Oncology | Admitting: Radiation Oncology

## 2017-07-18 DIAGNOSIS — Z510 Encounter for antineoplastic radiation therapy: Secondary | ICD-10-CM | POA: Diagnosis not present

## 2017-07-19 ENCOUNTER — Encounter: Payer: Self-pay | Admitting: Radiation Oncology

## 2017-07-19 NOTE — Progress Notes (Signed)
  Radiation Oncology         [phone] ________________________________  Name: Brandi Lewis MRN: [id number]  Date: 07/19/2017  DOB: 1966/04/20  End of Treatment Note  Diagnosis:   Malignant neoplasm of upper-outer quadrant of right female breast   Indication for treatment: Curative        Radiation treatment dates:  05/31/2017 - 07/18/2017  Site/dose:    Right breast - 50.4 Gy in 28 fractions  Right breast boost - 10 Gy in 5 fractions  Beams/energy:     Right breast: 3D/ 6X Right breast boost: Electron / 9E  Narrative: The patient tolerated radiation treatment relatively well.     Plan: The patient has completed radiation treatment. The patient will return to radiation oncology clinic for routine followup in one month. I advised them to call or return sooner if they have any questions or concerns related to their recovery or treatment.  ------------------------------------------------  Jodelle Gross, MD, PhD  This document serves as a record of services personally performed by Kyung Rudd, MD. It was created on his behalf by Valeta Harms, a trained medical scribe. The creation of this record is based on the scribe's personal observations and the provider's statements to them. This document has been checked and approved by the attending provider.

## 2017-08-29 ENCOUNTER — Encounter: Payer: Self-pay | Admitting: *Deleted

## 2017-08-29 ENCOUNTER — Ambulatory Visit
Admission: RE | Admit: 2017-08-29 | Payer: BLUE CROSS/BLUE SHIELD | Source: Ambulatory Visit | Admitting: Radiation Oncology

## 2017-08-29 NOTE — Progress Notes (Signed)
[place] returned telephone, "stated she is doing well over all since completing her radiation".  She rescheduled her one month follow up appointment for 11-01-at 0830.

## 2017-08-29 NOTE — Progress Notes (Signed)
[place] left a message to call back in re: to her one month follow up appointment for this morning.  Asked to speak to [place] for Shona Simpson, P.A. for Dr. Lisbeth Renshaw at [phone].

## 2017-09-03 ENCOUNTER — Other Ambulatory Visit: Payer: Self-pay | Admitting: Obstetrics and Gynecology

## 2017-09-11 ENCOUNTER — Encounter (HOSPITAL_COMMUNITY): Payer: Self-pay | Admitting: *Deleted

## 2017-09-13 ENCOUNTER — Ambulatory Visit
Admission: RE | Admit: 2017-09-13 | Discharge: 2017-09-13 | Disposition: A | Discharge status: Home / Self Care | Payer: BLUE CROSS/BLUE SHIELD | Source: Ambulatory Visit | Attending: Radiation Oncology | Admitting: Radiation Oncology

## 2017-09-13 ENCOUNTER — Encounter: Payer: Self-pay | Admitting: Radiation Oncology

## 2017-09-13 VITALS — BP 115/79 | HR 84 | Temp 97.8°F | Resp 18 | Ht <= 58 in | Wt 112.4 lb

## 2017-09-13 DIAGNOSIS — Z170 Estrogen receptor positive status [ER+]: Secondary | ICD-10-CM | POA: Diagnosis not present

## 2017-09-13 DIAGNOSIS — C50911 Malignant neoplasm of unspecified site of right female breast: Secondary | ICD-10-CM | POA: Diagnosis not present

## 2017-09-13 DIAGNOSIS — C50411 Malignant neoplasm of upper-outer quadrant of right female breast: Secondary | ICD-10-CM

## 2017-09-13 DIAGNOSIS — R110 Nausea: Secondary | ICD-10-CM | POA: Insufficient documentation

## 2017-09-13 NOTE — Progress Notes (Signed)
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  Interval Since Last Radiation: 8 weeks   05/31/2017 - 07/18/2017: Right breast - 50.4 Gy in 28 fractions  Right breast boost - 10 Gy in 5 fractions  Narrative:  The patient returns today for routine follow-up. During treatment she did very well with radiotherapy and did not have significant desquamation.                             On review of systems, the patient states she has been doing well overall.  She has had 3 episodes of hot flashes and at that time nausea that accompanied her symptoms.  She states that she been taking tamoxifen for the last month, and otherwise has been doing well.  She reports her skin has improved, specifically stating that the skin tone is improving.  She continues to use lotion on her skin.  She also continues to have difficulty with heavy vaginal bleeding. She is going to have a D&C with myosure ablation in a few weeks. She is not sure if her nausea is related to crampy pain in the pelvis when she has heavy bleeding. No other complaints are noted.  ALLERGIES:  is allergic to codeine.  Meds: Current Outpatient Prescriptions  Medication Sig Dispense Refill  . ibuprofen (ADVIL,MOTRIN) 800 MG tablet Take 1 tablet (800 mg total) by mouth every 8 (eight) hours as needed. 30 tablet 0  . tamoxifen (NOLVADEX) 20 MG tablet Take 1 tablet (20 mg total) by mouth daily. 90 tablet 3  . tiZANidine (ZANAFLEX) 4 MG capsule Take 4 mg by mouth 2 (two) times daily as needed for muscle spasms.    Marland Kitchen topiramate (TOPAMAX) 25 MG tablet Take 200 mg by mouth every evening.     . OnabotulinumtoxinA (BOTOX IJ) Inject as directed.    . ondansetron (ZOFRAN) 4  MG tablet Take 1 tablet (4 mg total) by mouth every 8 (eight) hours as needed for nausea or vomiting. (Patient not taking: Reported on 05/22/2017) 20 tablet 0   No current facility-administered medications for this encounter.     Physical Findings:  height is 5" (0.127 m) (abnormal) and weight is 112 lb 6.4 oz (51 kg). Her oral temperature is 97.8 F (36.6 C). Her blood pressure is 115/79 and her pulse is 84. Her respiration is 18 and oxygen saturation is 100%.  Pain Assessment Pain Score: 0-No pain/10 In general this is a well appearing African American female does not in no acute distress. She's alert and oriented x4 and appropriate throughout the examination. Cardiopulmonary assessment is negative for acute distress and she exhibits normal effort. The right breast was examined and reveals mild hyperpigmentation on the field distribution of her prior treatment.  No desquamation is identified.   Lab Findings: Lab Results  Component Value Date   WBC 4.4 04/04/2017   HGB 15.1 04/04/2017   HCT 44.5 04/04/2017   MCV 89.2 04/04/2017   PLT 227 04/04/2017     Radiographic Findings: No results found.  Impression/Plan: 1. 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 The patient has been doing  well since completion of radiotherapy. We discussed that we would be happy to continue to follow her as needed, but she will also continue to follow up with Dr. Lindi Adie in medical oncology. She was counseled on skin care as well as measures to avoid sun exposure to this area.  2. Survivorship. The patient will be seen in December for survivorship and she was encouraged to attend this appointment. 3.  Nausea intermittently.  Patient is encouraged to discuss this with Dr. Lindi Adie as it seems to relate to either heavy bleeding episodes versus the addition of her most recent medication which would be tamoxifen.      Carola Rhine, PAC

## 2017-09-18 ENCOUNTER — Encounter: Payer: Self-pay | Admitting: Physical Therapy

## 2017-09-18 ENCOUNTER — Ambulatory Visit: Payer: BLUE CROSS/BLUE SHIELD | Attending: Surgery | Admitting: Physical Therapy

## 2017-09-18 DIAGNOSIS — R293 Abnormal posture: Secondary | ICD-10-CM | POA: Diagnosis present

## 2017-09-18 DIAGNOSIS — L599 Disorder of the skin and subcutaneous tissue related to radiation, unspecified: Secondary | ICD-10-CM | POA: Insufficient documentation

## 2017-09-18 DIAGNOSIS — M6281 Muscle weakness (generalized): Secondary | ICD-10-CM | POA: Diagnosis present

## 2017-09-18 DIAGNOSIS — M25511 Pain in right shoulder: Secondary | ICD-10-CM | POA: Diagnosis present

## 2017-09-18 DIAGNOSIS — M25611 Stiffness of right shoulder, not elsewhere classified: Secondary | ICD-10-CM | POA: Diagnosis not present

## 2017-09-18 DIAGNOSIS — R600 Localized edema: Secondary | ICD-10-CM | POA: Diagnosis present

## 2017-09-18 NOTE — Therapy (Signed)
[place], [place], [address] Phone: [phone]   Fax:  [phone]  Physical Therapy Evaluation  Patient Details  Name: Brandi Lewis MRN: [id number] Date of Birth: Jun 12, 1966 Referring Provider: Dr. Erroll Luna   Encounter Date: 09/18/2017    Past Medical History:  Diagnosis Date  . Allergy    codeine  . Breast cancer ([facility]) 03/26/2017 bx   right breast   . H/O partial thyroidectomy 2006   nodule  . Headache(784.0)    migraine  . Thrombosed hemorrhoids     Past Surgical History:  Procedure Laterality Date  . BREAST BIOPSY  12/08/2005  . BREAST CYST EXCISION  2007   rt breast  . BREAST EXCISIONAL BIOPSY Right 03/30/2010  . BREAST EXCISIONAL BIOPSY Right 1984  . THYROID SURGERY  11/02/2005   thyroid exploration with isthmusectomy    There were no vitals filed for this visit.   Subjective Assessment - 09/18/17 1606    Subjective  I had a lumpectomy on April 30, 2017. I completed radiation on July 18, 2017. I have had tightness that began after radiation. It seems like when the weather changed it got even worse.     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 It measures 1.4 cm and is located in the upper outer quadrant. It is ER/PR positive and HER2 negative with a Ki67 of 15%. Had a right lumpectomy on 04/30/17 and completed radiation on 07/18/17. Will undergo a hysteroscopy on 09/21/17 to biopsy some polyps    Patient Stated Goals  to get full ROM of RUE and decrease pain    Currently in Pain?  Yes    Pain Score  7     Pain Location  Breast    Pain Orientation  Right;Lateral    Pain Descriptors / Indicators  Aching;Tightness pulling   pulling   Pain Type  Other (Comment);Acute pain since radiation   since radiation   Pain Radiating Towards  across chest    Pain Onset  More than a month ago    Pain  Frequency  Intermittent    Aggravating Factors   getting cold    Pain Relieving Factors  raising arm    Effect of Pain on Daily Activities  opening jar, cutting meat         [facility] PT Assessment - 09/18/17 0001      Assessment   Medical Diagnosis  Right breast cancer    Referring Provider  Dr. [name] Cornett    Onset Date/Surgical Date  04/30/17    Hand Dominance  Right    Prior Therapy  none      Precautions   Precautions  Other (comment)    Precaution Comments  at risk for lymphedema      Restrictions   Weight Bearing Restrictions  No      Balance Screen   Has the patient fallen in the past 6 months  No    Has the patient had a decrease in activity level because of a fear of falling?   No    Is the patient reluctant to leave their home because of a fear of falling?   No      Home Environment   Living Environment  Private residence    Living Arrangements  Spouse/significant other    Available Help at Discharge  Family    Type of Home  House    Home Access  Stairs to enter    Entrance Stairs-Number of Steps  6    Entrance Stairs-Rails  Can reach both    Home Layout  One level      Prior Function   Level of Independence  Independent    Vocation  Full time employment    Publishing rights manager work / billing at [employer]  pt reports she has been doing the post op breast exercises      Cognition   Overall Cognitive Status  Within Functional Limits for tasks assessed      Observation/Other Assessments   Observations  increased fibrosis in right lateral trunk and across pec with tenderness noted, increased hardness in right lateral trunk in area where pt has pain some slight possible edema in right trunk   some slight possible edema in right trunk     Posture/Postural Control   Posture/Postural Control  Postural limitations    Postural Limitations  Forward head;Rounded Shoulders      AROM   Right Shoulder Extension  --    Right  Shoulder Flexion  143 Degrees    Right Shoulder ABduction  143 Degrees    Right Shoulder Internal Rotation  63 Degrees    Right Shoulder External Rotation  88 Degrees    Left Shoulder Extension  --    Left Shoulder Flexion  154 Degrees    Left Shoulder ABduction  180 Degrees    Left Shoulder Internal Rotation  67 Degrees    Left Shoulder External Rotation  83 Degrees    Cervical Flexion  --    Cervical Extension  --    Cervical - Right Side Bend  --    Cervical - Left Side Bend  --    Cervical - Right Rotation  --    Cervical - Left Rotation  --      Strength   Overall Strength  --        LYMPHEDEMA/ONCOLOGY QUESTIONNAIRE - 09/18/17 1623      Type   Cancer Type  Right breast cancer      Surgeries   Lumpectomy Date  04/30/17    Sentinel Lymph Node Biopsy Date  04/30/17    Number Lymph Nodes Removed  6      Date Lymphedema/Swelling Started   Date  07/18/17      Treatment   Active Chemotherapy Treatment  No    Past Chemotherapy Treatment  No    Active Radiation Treatment  No    Past Radiation Treatment  Yes    Date  07/18/17    Current Hormone Treatment  Yes    Drug Name  Tamoxifen    Past Hormone Therapy  No      What other symptoms do you have   Are you Having Heaviness or Tightness  Yes    Are you having Pain  Yes    Are you having pitting edema  No    Is it Hard or Difficult finding clothes that fit  No    Do you have infections  No    Is there Decreased scar mobility  No      Lymphedema Assessments   Lymphedema Assessments  Upper extremities      Right Upper Extremity Lymphedema   10 cm Proximal to Olecranon Process  24.7 cm    Olecranon Process  21.1 cm    10 cm Proximal to Ulnar Styloid  Process  19.1 cm    Just Proximal to Ulnar Styloid Process  12.8 cm    Across Hand at [employer]  16.4 cm    At [place] of 2nd Digit  5.4 cm      Left Upper Extremity Lymphedema   10 cm Proximal to Olecranon Process  24.6 cm    Olecranon Process  22 cm    10 cm  Proximal to Ulnar Styloid Process  18.3 cm    Just Proximal to Ulnar Styloid Process  12.6 cm    Across Hand at [employer]  16.5 cm    At [place] of 2nd Digit  5 cm        Quick Dash - 09/18/17 0001    Open a tight or new jar  Moderate difficulty    Do heavy household chores (wash walls, wash floors)  Moderate difficulty    Carry a shopping bag or briefcase  Mild difficulty    Wash your back  Severe difficulty    Use a knife to cut food  Moderate difficulty    Recreational activities in which you take some force or impact through your arm, shoulder, or hand (golf, hammering, tennis)  Moderate difficulty    During the past week, to what extent has your arm, shoulder or hand problem interfered with your normal social activities with family, friends, neighbors, or groups?  Slightly    During the past week, to what extent has your arm, shoulder or hand problem limited your work or other regular daily activities  Slightly    Arm, shoulder, or hand pain.  Mild    Tingling (pins and needles) in your arm, shoulder, or hand  None    Difficulty Sleeping  No difficulty    DASH Score  34.09 %       Objective measurements completed on examination: See above findings.      [facility] Adult PT Treatment/Exercise - 09/18/17 0001      Shoulder Exercises: Supine   ABduction  AAROM;Right;10 reps with dowel with 5 sec holds   with dowel with 5 sec holds                 [facility] Goals - 09/18/17 1654      CC Long Term Goal  #1   Title  Pt will report a 65% improvement in right lateral trunk tightness to allow for decreased discomfort    Time  4    Period  Weeks    Status  New    Target Date  10/16/17      CC Long Term Goal  #2   Title  Pt will report a 75% improvement in pain in right breast to allow improved function    Time  4    Period  Weeks    Status  New    Target Date  10/16/17      CC Long Term Goal  #3   Title  Pt to demonstrate 160 degrees of bilateral  shoulder flexion to allow pt to reach overhead.    Baseline  R 143, L 154    Time  4    Period  Weeks    Status  New    Target Date  10/16/17      CC Long Term Goal  #4   Title  pt to demosntrate 170 degrees of right shoulder abduction to allow pt to reach out to side    Baseline  143    Time  4    Period  Weeks    Status  New    Target Date  10/16/17      CC Long Term Goal  #5   Title  Pt to be independent in a home exercise program for continued strengthening and stretching.     Time  4    Period  Weeks    Status  New          Plan - 09/18/17 1656    Clinical Impression Statement  Pt underwent a right lumpectomy and SLNB on 04/30/17 and completed radiation on 07/18/17 for treatment of right breast cancer. Pt will be undergoing a hysteroscopy on 09/21/17 to biopsy some suspicious polyps. She presents with decreased right shoulder ROM, right axillary tightness, increased fibrosis of right lateral trunk and right shoulder weakness. She may have slight swelling in right lateral trunk. She would benefit from skilled PT services to increase right shoulder ROM and strength, decrease pain in R breast, decrease axillary tightness and progress pt towards independence with a home exercise program.     History and Personal Factors relevant to plan of care:  pt is right handed, has recieved radiation    Clinical Presentation  Evolving    Clinical Presentation due to:  undergoing a hysteroscopy on 09/21/17 for suspicious polyps    Clinical Decision Making  Moderate    Rehab Potential  Good    Clinical Impairments Affecting Rehab Potential  hx of radiation, pt undergoing hysteroscopy on 09/21/17    PT Frequency  2x / week    PT Duration  4 weeks    PT Treatment/Interventions  ADLs/Self Care Home Management;Therapeutic exercise;Therapeutic activities;Patient/family education;Orthotic Fit/Training;Manual lymph drainage;Manual techniques;Scar mobilization;Passive range of motion;Taping    PT Next  Visit Plan  begin gentle AA/A/PROM to right shoulder, assess indep with supine dowel abduction, pulleys, ball    PT Home Exercise Plan  post op breast HEP, supine dowel ex    Consulted and Agree with Plan of Care  Patient       Patient will benefit from skilled therapeutic intervention in order to improve the following deficits and impairments:  Increased fascial restricitons, Pain, Postural dysfunction, Decreased scar mobility, Decreased strength, Decreased range of motion, Increased edema  Visit Diagnosis: Stiffness of right shoulder, not elsewhere classified - Plan: PT plan of care cert/re-cert  Acute pain of right shoulder - Plan: PT plan of care cert/re-cert  Muscle weakness (generalized) - Plan: PT plan of care cert/re-cert  Localized edema - Plan: PT plan of care cert/re-cert  Disorder of the skin and subcutaneous tissue related to radiation, unspecified - Plan: PT plan of care cert/re-cert     Problem List Patient Active Problem List   Diagnosis Date Noted  . Malignant neoplasm of upper-outer quadrant of right breast in female, estrogen receptor positive ([facility]) 04/03/2017  . Thrombosed external hemorrhoid 12/04/2011  . YVOPFYTW(446.2Manus Gunning 09/18/2017, 5:03 PM  [place], [place], [address] Phone: [phone]   Fax:  [phone]  Name: Brandi Lewis MRN: [id number] Date of Birth: 1966-11-08  Manus Gunning, PT 09/18/17 5:04 PM

## 2017-09-18 NOTE — Patient Instructions (Signed)
Shoulder: Abduction (Supine)    With right arm flat on floor, hold dowel in palm. Slowly move arm up to side of head by pushing with opposite arm. Do not let elbow bend. Hold _5-15___ seconds. Repeat __10__ times. Do _2___ sessions per day. CAUTION: Stretch slowly and gently.  Copyright  VHI. All rights reserved.

## 2017-09-21 ENCOUNTER — Ambulatory Visit (HOSPITAL_COMMUNITY): Payer: BLUE CROSS/BLUE SHIELD | Admitting: Anesthesiology

## 2017-09-21 ENCOUNTER — Encounter (HOSPITAL_COMMUNITY): Payer: Self-pay | Admitting: Anesthesiology

## 2017-09-21 ENCOUNTER — Encounter (HOSPITAL_COMMUNITY)
Admission: AD | Disposition: A | Discharge status: Home / Self Care | Payer: Self-pay | Source: Ambulatory Visit | Attending: Obstetrics and Gynecology

## 2017-09-21 ENCOUNTER — Ambulatory Visit (HOSPITAL_COMMUNITY)
Admission: AD | Admit: 2017-09-21 | Discharge: 2017-09-21 | Disposition: A | Discharge status: Home / Self Care | Payer: BLUE CROSS/BLUE SHIELD | Source: Ambulatory Visit | Attending: Obstetrics and Gynecology | Admitting: Obstetrics and Gynecology

## 2017-09-21 DIAGNOSIS — N840 Polyp of corpus uteri: Secondary | ICD-10-CM | POA: Diagnosis not present

## 2017-09-21 DIAGNOSIS — Z888 Allergy status to other drugs, medicaments and biological substances status: Secondary | ICD-10-CM | POA: Insufficient documentation

## 2017-09-21 DIAGNOSIS — N939 Abnormal uterine and vaginal bleeding, unspecified: Secondary | ICD-10-CM | POA: Diagnosis present

## 2017-09-21 DIAGNOSIS — Z79899 Other long term (current) drug therapy: Secondary | ICD-10-CM | POA: Diagnosis not present

## 2017-09-21 DIAGNOSIS — C50911 Malignant neoplasm of unspecified site of right female breast: Secondary | ICD-10-CM | POA: Insufficient documentation

## 2017-09-21 HISTORY — DX: Acquired absence of other part of head and neck: Z90.09

## 2017-09-21 HISTORY — PX: DILATATION & CURETTAGE/HYSTEROSCOPY WITH MYOSURE: SHX6511

## 2017-09-21 LAB — CBC
HEMATOCRIT: 44.2 % (ref 36.0–46.0)
HEMOGLOBIN: 15.4 g/dL — AB (ref 12.0–15.0)
MCH: 30.6 pg (ref 26.0–34.0)
MCHC: 34.8 g/dL (ref 30.0–36.0)
MCV: 87.9 fL (ref 78.0–100.0)
Platelets: 162 10*3/uL (ref 150–400)
RBC: 5.03 MIL/uL (ref 3.87–5.11)
RDW: 13.4 % (ref 11.5–15.5)
WBC: 4.9 10*3/uL (ref 4.0–10.5)

## 2017-09-21 SURGERY — DILATATION & CURETTAGE/HYSTEROSCOPY WITH MYOSURE
Anesthesia: General | Site: Vagina

## 2017-09-21 MED ORDER — MEPERIDINE HCL 25 MG/ML IJ SOLN: 6.2500 mg | INTRAMUSCULAR | Status: DC | PRN

## 2017-09-21 MED ORDER — PROPOFOL 10 MG/ML IV BOLUS
INTRAVENOUS | Status: AC
  Filled 2017-09-21: qty 20

## 2017-09-21 MED ORDER — KETOROLAC TROMETHAMINE 30 MG/ML IJ SOLN
INTRAMUSCULAR | Status: AC
  Filled 2017-09-21: qty 1

## 2017-09-21 MED ORDER — FENTANYL CITRATE (PF) 100 MCG/2ML IJ SOLN
INTRAMUSCULAR | Status: AC
  Administered 2017-09-21: 25 ug via INTRAVENOUS
  Filled 2017-09-21: qty 2

## 2017-09-21 MED ORDER — FENTANYL CITRATE (PF) 100 MCG/2ML IJ SOLN
INTRAMUSCULAR | Status: DC | PRN
  Administered 2017-09-21: 50 ug via INTRAVENOUS

## 2017-09-21 MED ORDER — SCOPOLAMINE 1 MG/3DAYS TD PT72
MEDICATED_PATCH | TRANSDERMAL | Status: AC
  Administered 2017-09-21: 1.5 mg via TRANSDERMAL
  Filled 2017-09-21: qty 1

## 2017-09-21 MED ORDER — ONDANSETRON HCL 4 MG/2ML IJ SOLN
INTRAMUSCULAR | Status: DC | PRN
Start: 2017-09-21 — End: 2017-09-21
  Administered 2017-09-21: 4 mg via INTRAVENOUS

## 2017-09-21 MED ORDER — SCOPOLAMINE 1 MG/3DAYS TD PT72
1.0000 | MEDICATED_PATCH | Freq: Once | TRANSDERMAL | Status: DC
  Administered 2017-09-21: 1.5 mg via TRANSDERMAL

## 2017-09-21 MED ORDER — LIDOCAINE HCL (CARDIAC) 20 MG/ML IV SOLN
INTRAVENOUS | Status: AC
  Filled 2017-09-21: qty 5

## 2017-09-21 MED ORDER — METOCLOPRAMIDE HCL 5 MG/ML IJ SOLN: 10.0000 mg | Freq: Once | INTRAMUSCULAR | Status: DC | PRN

## 2017-09-21 MED ORDER — HYDROCODONE-ACETAMINOPHEN 7.5-325 MG PO TABS: 1.0000 | ORAL_TABLET | Freq: Once | ORAL | Status: DC | PRN

## 2017-09-21 MED ORDER — MIDAZOLAM HCL 2 MG/2ML IJ SOLN
INTRAMUSCULAR | Status: AC
  Filled 2017-09-21: qty 2

## 2017-09-21 MED ORDER — KETOROLAC TROMETHAMINE 30 MG/ML IJ SOLN
INTRAMUSCULAR | Status: DC | PRN
  Administered 2017-09-21: 30 mg via INTRAMUSCULAR

## 2017-09-21 MED ORDER — DEXAMETHASONE SODIUM PHOSPHATE 4 MG/ML IJ SOLN
INTRAMUSCULAR | Status: AC
  Filled 2017-09-21: qty 1

## 2017-09-21 MED ORDER — FENTANYL CITRATE (PF) 100 MCG/2ML IJ SOLN
25.0000 ug | INTRAMUSCULAR | Status: DC | PRN
  Administered 2017-09-21 (×2): 25 ug via INTRAVENOUS

## 2017-09-21 MED ORDER — DEXAMETHASONE SODIUM PHOSPHATE 10 MG/ML IJ SOLN
INTRAMUSCULAR | Status: DC | PRN
  Administered 2017-09-21: 4 mg via INTRAVENOUS

## 2017-09-21 MED ORDER — LACTATED RINGERS IV SOLN
INTRAVENOUS | Status: DC
  Administered 2017-09-21: 125 mL/h via INTRAVENOUS
  Administered 2017-09-21 (×2): via INTRAVENOUS

## 2017-09-21 MED ORDER — PROPOFOL 10 MG/ML IV BOLUS
INTRAVENOUS | Status: DC | PRN
  Administered 2017-09-21: 120 mg via INTRAVENOUS

## 2017-09-21 MED ORDER — LIDOCAINE HCL (CARDIAC) 20 MG/ML IV SOLN
INTRAVENOUS | Status: DC | PRN
  Administered 2017-09-21: 50 mg via INTRAVENOUS

## 2017-09-21 MED ORDER — ONDANSETRON HCL 4 MG/2ML IJ SOLN
INTRAMUSCULAR | Status: AC
  Filled 2017-09-21: qty 2

## 2017-09-21 MED ORDER — FENTANYL CITRATE (PF) 100 MCG/2ML IJ SOLN
INTRAMUSCULAR | Status: AC
  Filled 2017-09-21: qty 2

## 2017-09-21 MED ORDER — MIDAZOLAM HCL 2 MG/2ML IJ SOLN
INTRAMUSCULAR | Status: DC | PRN
  Administered 2017-09-21: 1 mg via INTRAVENOUS

## 2017-09-21 SURGICAL SUPPLY — 16 items
CANISTER SUCT 3000ML PPV (MISCELLANEOUS) ×2 IMPLANT
CATH ROBINSON RED A/P 16FR (CATHETERS) ×2 IMPLANT
CONTAINER PREFILL 10% NBF 60ML (FORM) ×4 IMPLANT
DEVICE MYOSURE LITE (MISCELLANEOUS) IMPLANT
DEVICE MYOSURE REACH (MISCELLANEOUS) IMPLANT
FILTER ARTHROSCOPY CONVERTOR (FILTER) ×2 IMPLANT
GLOVE BIOGEL PI IND STRL 7.0 (GLOVE) ×2 IMPLANT
GLOVE BIOGEL PI INDICATOR 7.0 (GLOVE) ×2
GLOVE ECLIPSE 6.5 STRL STRAW (GLOVE) ×2 IMPLANT
GOWN STRL REUS W/TWL LRG LVL3 (GOWN DISPOSABLE) ×4 IMPLANT
PACK VAGINAL MINOR WOMEN LF (CUSTOM PROCEDURE TRAY) ×2 IMPLANT
PAD OB MATERNITY 4.3X12.25 (PERSONAL CARE ITEMS) ×2 IMPLANT
SEAL ROD LENS SCOPE MYOSURE (ABLATOR) ×2 IMPLANT
TOWEL OR 17X24 6PK STRL BLUE (TOWEL DISPOSABLE) ×4 IMPLANT
TUBING AQUILEX INFLOW (TUBING) ×2 IMPLANT
TUBING AQUILEX OUTFLOW (TUBING) ×2 IMPLANT

## 2017-09-21 NOTE — Transfer of Care (Signed)
Immediate Anesthesia Transfer of Care Note  Patient: Brandi Lewis  Procedure(s) Performed: DILATATION & CURETTAGE/HYSTEROSCOPY WITH MYOSURE (N/A Vagina )  Patient Location: PACU  Anesthesia Type:General  Level of Consciousness: awake, alert  and oriented  Airway & Oxygen Therapy: Patient Spontanous Breathing and Patient connected to nasal cannula oxygen  Post-op Assessment: Report given to RN and Post -op Vital signs reviewed and stable  Post vital signs: Reviewed and stable  Last Vitals:  Vitals:   09/21/17 1217 09/21/17 1427  BP: 120/77 133/84  Pulse: 72 (!) 102  Resp: 16 10  Temp: 36.7 C 36.4 C  SpO2: 100%     Last Pain:  Vitals:   09/21/17 1217  TempSrc: Oral      Patients Stated Pain Goal: 3 (02/54/86 2824)  Complications: No apparent anesthesia complications

## 2017-09-21 NOTE — Anesthesia Preprocedure Evaluation (Signed)
Anesthesia Evaluation  Patient identified by MRN, date of birth, ID band Patient awake    Reviewed: Allergy & Precautions, NPO status , Patient's Chart, lab work & pertinent test results  Airway Mallampati: II  TM Distance: >3 FB Neck ROM: Full    Dental no notable dental hx. (+) Teeth Intact   Pulmonary neg pulmonary ROS,    Pulmonary exam normal breath sounds clear to auscultation       Cardiovascular negative cardio ROS Normal cardiovascular exam Rhythm:Regular Rate:Normal     Neuro/Psych  Headaches, negative psych ROS   GI/Hepatic negative GI ROS, Neg liver ROS,   Endo/Other  Hx/o breast Ca  Renal/GU negative Renal ROS  negative genitourinary   Musculoskeletal   Abdominal   Peds  Hematology negative hematology ROS (+)   Anesthesia Other Findings   Reproductive/Obstetrics Endometrial mass DUB                             Anesthesia Physical Anesthesia Plan  ASA: II  Anesthesia Plan:    Post-op Pain Management:    Induction:   PONV Risk Score and Plan: 4 or greater and Metaclopromide, Scopolamine patch - Pre-op, Midazolam, Propofol infusion, Ondansetron, Dexamethasone and Treatment may vary due to age or medical condition  Airway Management Planned: LMA  Additional Equipment:   Intra-op Plan:   Post-operative Plan: Extubation in OR  Informed Consent: I have reviewed the patients History and Physical, chart, labs and discussed the procedure including the risks, benefits and alternatives for the proposed anesthesia with the patient or authorized representative who has indicated his/her understanding and acceptance.   Dental advisory given  Plan Discussed with: CRNA, Anesthesiologist and Surgeon  Anesthesia Plan Comments:         Anesthesia Quick Evaluation

## 2017-09-21 NOTE — H&P (Signed)
Brandi Lewis is an 51 y.o. female.MBF with AUB presents for dx hysteroscopy, D&C, removal of endom mass due to sonohysterogram finding of endom polyp. W/u done for AUB PMH notable for right breast cancer Pertinent Gynecological History: Menses: irreg Bleeding: dysfunctional uterine bleeding Contraception: none DES exposure: denies Blood transfusions: none Sexually transmitted diseases: no past history Previous GYN Procedures: c/s , laser of cervix Last mammogram: normal Date: 03/2017 Last pap: normal Date: 03/2017 OB History: G1P1   Menstrual History: Menarche age: n/a Patient's last menstrual period was 09/08/2017 (exact date).    Past Medical History:  Diagnosis Date  . Allergy    codeine  . Breast cancer ([facility]) 03/26/2017 bx   right breast   . H/O partial thyroidectomy 2006   nodule  . Headache(784.0)    migraine  . Thrombosed hemorrhoids     Past Surgical History:  Procedure Laterality Date  . BREAST BIOPSY  12/08/2005  . BREAST CYST EXCISION  2007   rt breast  . BREAST EXCISIONAL BIOPSY Right 03/30/2010  . BREAST EXCISIONAL BIOPSY Right 1984  . THYROID SURGERY  11/02/2005   thyroid exploration with isthmusectomy    Family History  Problem Relation Age of Onset  . Hypertension Mother   . Bone cancer Father   . Prostate cancer Father   . Colon cancer Maternal Grandfather     Social History:  reports that  has never smoked. she has never used smokeless tobacco. She reports that she does not drink alcohol or use drugs.  Allergies:  Allergies  Allergen Reactions  . Codeine Nausea And Vomiting    Medications Prior to Admission  Medication Sig Dispense Refill Last Dose  . docusate sodium (COLACE) 100 MG capsule Take 100 mg daily by mouth.   09/20/2017 at Unknown time  . ibuprofen (ADVIL,MOTRIN) 800 MG tablet Take 1 tablet (800 mg total) by mouth every 8 (eight) hours as needed. (Patient taking differently: Take 800 mg every 8 (eight) hours as needed by  mouth for headache or moderate pain. ) 30 tablet 0 Past Month at Unknown time  . promethazine (PHENERGAN) 25 MG tablet Take 25 mg every 6 (six) hours as needed by mouth for nausea or vomiting.   Past Month at Unknown time  . tamoxifen (NOLVADEX) 20 MG tablet Take 1 tablet (20 mg total) by mouth daily. 90 tablet 3 09/20/2017 at Unknown time  . Topiramate ER (QUDEXY XR) 200 MG CS24 sprinkle capsule Take 200 mg daily by mouth.   09/20/2017 at Unknown time  . ondansetron (ZOFRAN) 4 MG tablet Take 1 tablet (4 mg total) by mouth every 8 (eight) hours as needed for nausea or vomiting. (Patient not taking: Reported on 05/22/2017) 20 tablet 0 Completed Course at Unknown time  . tiZANidine (ZANAFLEX) 4 MG capsule Take 4 mg daily as needed by mouth for muscle spasms.    More than a month at Unknown time    Review of Systems  All other systems reviewed and are negative.   Blood pressure 120/77, pulse 72, temperature 98.1 F (36.7 C), temperature source Oral, resp. rate 16, height 5' (1.524 m), weight 50.8 kg (112 lb), last menstrual period 09/08/2017, SpO2 100 %. Physical Exam  Constitutional: She is oriented to person, place, and time. She appears well-developed and well-nourished.  HENT:  Head: Atraumatic.  Eyes: EOM are normal.  Neck: Neck supple.  Cardiovascular: Regular rhythm.  Respiratory: Breath sounds normal.  GI: Soft.  Genitourinary: Vagina normal and uterus normal.  Genitourinary  Comments: Cervix closed/long And no palp mass  Neurological: She is alert and oriented to person, place, and time.  Skin: Skin is warm and dry.  Psychiatric: She has a normal mood and affect.    Results for orders placed or performed during the hospital encounter of 09/21/17 (from the past 24 hour(s))  CBC     Status: Abnormal   Collection Time: 09/21/17 12:10 PM  Result Value Ref Range   WBC 4.9 4.0 - 10.5 K/uL   RBC 5.03 3.87 - 5.11 MIL/uL   Hemoglobin 15.4 (H) 12.0 - 15.0 g/dL   HCT 44.2 36.0 - 46.0 %    MCV 87.9 78.0 - 100.0 fL   MCH 30.6 26.0 - 34.0 pg   MCHC 34.8 30.0 - 36.0 g/dL   RDW 13.4 11.5 - 15.5 %   Platelets 162 150 - 400 K/uL    No results found.  Assessment/Plan: AUB Endometrial mass P) dx hysteroscopy, D&C, resection of endom mass. Risk of surgery includes infection, bleeding, injury to surrounding organ structures, internal scar tissue, fluid overload, uterine perforation and its risk ( 11/998). All ? Answered   Britne Borelli A 09/21/2017, 1:20 PM

## 2017-09-21 NOTE — Brief Op Note (Signed)
09/21/2017  2:27 PM  PATIENT:  Brandi Lewis  51 y.o. female  PRE-OPERATIVE DIAGNOSIS:  Endometrial Mass, abnormal Uterine Bleeding  POST-OPERATIVE DIAGNOSIS:  endometrial polyp, abnormal uterine bleeding  PROCEDURE:  Diagnostic hysteroscopy, hysteroscopic resection of endometrial polyp, dilation and curettage  SURGEON:  Surgeon(s) and Role:    * Servando Salina, MD - Primary  PHYSICIAN ASSISTANT:   ASSISTANTS: none   ANESTHESIA:   general Findings; multiple small endom polyps, tubal ostia seen EBL:  10 mL   BLOOD ADMINISTERED:none  DRAINS: none   LOCAL MEDICATIONS USED:  NONE  SPECIMEN:  Source of Specimen:  emc with polyp  DISPOSITION OF SPECIMEN:  PATHOLOGY  COUNTS:  YES  TOURNIQUET:  * No tourniquets in log *  DICTATION: .Other Dictation: Dictation Number [id number]  PLAN OF CARE: Discharge to home after PACU  PATIENT DISPOSITION:  PACU - hemodynamically stable.   Delay start of Pharmacological VTE agent (>24hrs) due to surgical blood loss or risk of bleeding: no

## 2017-09-21 NOTE — Anesthesia Procedure Notes (Signed)
Procedure Name: LMA Insertion Date/Time: 09/21/2017 1:39 PM Performed by: Bufford Spikes, CRNA Pre-anesthesia Checklist: Patient identified, Emergency Drugs available, Suction available and Patient being monitored Patient Re-evaluated:Patient Re-evaluated prior to induction Oxygen Delivery Method: Circle system utilized Preoxygenation: Pre-oxygenation with 100% oxygen Induction Type: IV induction Ventilation: Mask ventilation without difficulty LMA: LMA inserted LMA Size: 3.0 Number of attempts: 1 Placement Confirmation: positive ETCO2 Tube secured with: Tape Dental Injury: Teeth and Oropharynx as per pre-operative assessment

## 2017-09-21 NOTE — Anesthesia Postprocedure Evaluation (Signed)
Anesthesia Post Note  Patient: Brandi Lewis  Procedure(s) Performed: DILATATION & CURETTAGE/HYSTEROSCOPY WITH MYOSURE (N/A Vagina )     Patient location during evaluation: PACU Anesthesia Type: General Level of consciousness: awake and alert and oriented Pain management: pain level controlled Vital Signs Assessment: post-procedure vital signs reviewed and stable Respiratory status: spontaneous breathing, nonlabored ventilation and respiratory function stable Cardiovascular status: blood pressure returned to baseline and stable Postop Assessment: no apparent nausea or vomiting Anesthetic complications: no    Last Vitals:  Vitals:   09/21/17 1508 09/21/17 1515  BP:  124/71  Pulse: 87 89  Resp: 13 12  Temp:    SpO2: 100% 100%    Last Pain:  Vitals:   09/21/17 1515  TempSrc:   PainSc: (P) 0-No pain   Pain Goal: Patients Stated Pain Goal: 3 (09/21/17 1508)               Marieelena Bartko A.

## 2017-09-21 NOTE — Discharge Instructions (Signed)
DISCHARGE INSTRUCTIONS: HYSTEROSCOPY / ENDOMETRIAL ABLATION °The following instructions have been prepared to help you care for yourself upon your return home. ° °May Remove Scop patch on or before ° °May take Ibuprofen after ° °May take stool softner while taking narcotic pain medication to prevent constipation.  Drink plenty of water. ° °Personal hygiene: °• Use sanitary pads for vaginal drainage, not tampons. °• Shower the day after your procedure. °• NO tub baths, pools or Jacuzzis for 2-3 weeks. °• Wipe front to back after using the bathroom. ° °Activity and limitations: °• Do NOT drive or operate any equipment for 24 hours. The effects of anesthesia are still present °and drowsiness may result. °• Do NOT rest in bed all day. °• Walking is encouraged. °• Walk up and down stairs slowly. °• You may resume your normal activity in one to two days or as indicated by your physician. °Sexual activity: NO intercourse for at least 2 weeks after the procedure, or as indicated by your °Doctor. ° °Diet: Eat a light meal as desired this evening. You may resume your usual diet tomorrow. ° °Return to Work: You may resume your work activities in one to two days or as indicated by your °Doctor. ° °What to expect after your surgery: Expect to have vaginal bleeding/discharge for 2-3 days and °spotting for up to 10 days. It is not unusual to have soreness for up to 1-2 weeks. You may have a °slight burning sensation when you urinate for the first day. Mild cramps may continue for a couple of °days. You may have a regular period in 2-6 weeks. ° °Call your doctor for any of the following: °• Excessive vaginal bleeding or clotting, saturating and changing one pad every hour. °• Inability to urinate 6 hours after discharge from hospital. °• Pain not relieved by pain medication. °• Fever of 100.4° F or greater. °• Unusual vaginal discharge or odor. ° °Return to office _________________Call for an appointment  ___________________ °Patient’s signature: ______________________ °Nurse’s signature ________________________ ° °Post Anesthesia Care Unit [phone] °Post Anesthesia Home Care Instructions ° °Activity: °Get plenty of rest for the remainder of the day. A responsible individual must stay with you for 24 hours following the procedure.  °For the next 24 hours, DO NOT: °-Drive a car °-Operate machinery °-Drink alcoholic beverages °-Take any medication unless instructed by your physician °-Make any legal decisions or sign important papers. ° °Meals: °Start with liquid foods such as gelatin or soup. Progress to regular foods as tolerated. Avoid greasy, spicy, heavy foods. If nausea and/or vomiting occur, drink only clear liquids until the nausea and/or vomiting subsides. Call your physician if vomiting continues. ° °Special Instructions/Symptoms: °Your throat may feel dry or sore from the anesthesia or the breathing tube placed in your throat during surgery. If this causes discomfort, gargle with warm salt water. The discomfort should disappear within 24 hours. ° °If you had a scopolamine patch placed behind your ear for the management of post- operative nausea and/or vomiting: ° °1. The medication in the patch is effective for 72 hours, after which it should be removed.  Wrap patch in a tissue and discard in the trash. Wash hands thoroughly with soap and water. °2. You may remove the patch earlier than 72 hours if you experience unpleasant side effects which may include dry mouth, dizziness or visual disturbances. °3. Avoid touching the patch. Wash your hands with soap and water after contact with the patch. °  ° °

## 2017-09-22 ENCOUNTER — Encounter (HOSPITAL_COMMUNITY): Payer: Self-pay | Admitting: Obstetrics and Gynecology

## 2017-09-24 NOTE — Op Note (Signed)
NAME:  Brandi Lewis, Brandi Lewis                 ACCOUNT NO.:  MEDICAL RECORD NO.:  [id number]  LOCATION:                                 FACILITY:  PHYSICIAN:  Servando Salina, M.D.    DATE OF BIRTH:  DATE OF PROCEDURE:  09/21/2017 DATE OF DISCHARGE:                              OPERATIVE REPORT   PREOPERATIVE DIAGNOSES: 1. Abnormal uterine bleeding. 2. Endometrial mass.  PROCEDURES: 1. Diagnostic hysteroscopy. 2. Hysteroscopic resection of endometrial polyps using MyoSure,     dilation and curettage.  POSTOPERATIVE DIAGNOSES: 1. Abnormal uterine bleeding. 2. Endometrial polyps.  ANESTHESIA:  General.  SURGEON:  Servando Salina, M.D.  ASSISTANT:  None.  DESCRIPTION OF PROCEDURE:  Under adequate general anesthesia, the patient was placed in the dorsal lithotomy position.  She was sterilely prepped and draped in usual fashion.  Bladder catheterized for moderate amount of urine.  Examination under anesthesia revealed anteverted uterus.  No adnexal masses could be appreciated.  Bivalve speculum was placed in the vagina.  Single-tooth tenaculum was placed on the anterior lip of the cervix.  The cervix was then serially dilated up to #23 [facility] dilator.  Using the MyoSure diagnostic hysteroscope, the cavity was entered.  Multiple small polypoid lesions were noted.  Both tubal ostia were seen.  Using the Lite MyoSure resectoscope, the endometrium and the polypoid lesions were all resected.  The endocervical canal was without any lesion.  All instruments were then removed from the vagina.  SPECIMEN:  Labeled endometrial curetting with polyps was sent to Pathology.  ESTIMATED BLOOD LOSS:  Minimal.  FLUID DEFICIT:  715 ml.  Sponge and instrument counts x2 were correct.  COMPLICATIONS:  None.  The patient tolerated the procedure well, was transferred to recovery room in stable condition.     Servando Salina, M.D.     [place]/MEDQ  D:  09/21/2017  T:  09/21/2017   Job:  [id number]

## 2017-09-25 ENCOUNTER — Ambulatory Visit: Payer: BLUE CROSS/BLUE SHIELD | Admitting: Physical Therapy

## 2017-09-25 ENCOUNTER — Encounter: Payer: Self-pay | Admitting: Physical Therapy

## 2017-09-25 DIAGNOSIS — L599 Disorder of the skin and subcutaneous tissue related to radiation, unspecified: Secondary | ICD-10-CM

## 2017-09-25 DIAGNOSIS — R293 Abnormal posture: Secondary | ICD-10-CM

## 2017-09-25 DIAGNOSIS — R600 Localized edema: Secondary | ICD-10-CM

## 2017-09-25 DIAGNOSIS — M25611 Stiffness of right shoulder, not elsewhere classified: Secondary | ICD-10-CM | POA: Diagnosis not present

## 2017-09-25 DIAGNOSIS — M6281 Muscle weakness (generalized): Secondary | ICD-10-CM

## 2017-09-25 DIAGNOSIS — M25511 Pain in right shoulder: Secondary | ICD-10-CM

## 2017-09-25 NOTE — Therapy (Signed)
[place], [place], [address] Phone: [phone]   Fax:  [phone]  Physical Therapy Treatment  Patient Details  Name: Brandi Lewis MRN: [id number] Date of Birth: 1966/01/21 Referring Provider: Dr. Erroll Luna   Encounter Date: 09/25/2017  PT End of Session - 09/25/17 1155    Visit Number  2    Number of Visits  9    Date for PT Re-Evaluation  10/16/17    PT Start Time  1100    PT Stop Time  1145    PT Time Calculation (min)  45 min    Activity Tolerance  Patient tolerated treatment well    Behavior During Therapy  [facility] for tasks assessed/performed       Past Medical History:  Diagnosis Date  . Allergy    codeine  . Breast cancer ([place]) 03/26/2017 bx   right breast   . H/O partial thyroidectomy 2006   nodule  . Headache(784.0)    migraine  . Thrombosed hemorrhoids     Past Surgical History:  Procedure Laterality Date  . BREAST BIOPSY  12/08/2005  . BREAST CYST EXCISION  2007   rt breast  . BREAST EXCISIONAL BIOPSY Right 03/30/2010  . BREAST EXCISIONAL BIOPSY Right 1984  . THYROID SURGERY  11/02/2005   thyroid exploration with isthmusectomy    There were no vitals filed for this visit.  Subjective Assessment - 09/25/17 1109    Subjective  Pt had a vaginal hysteroscopy last Friday and will get results in  about a week.  She is having some abdominal cramping.  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 It measures 1.4 cm and is located in the upper outer quadrant. It is ER/PR positive and HER2 negative with a Ki67 of 15%. Had a right lumpectomy on 04/30/17 and completed radiation on 07/18/17. Will undergo a hysteroscopy on 09/21/17 to biopsy some polyps    Patient Stated Goals  to get full ROM of RUE and  decrease pain    Currently in Pain?  Yes    Pain Score  6     Pain Location  Chest    Pain Orientation  Right    Pain Descriptors / Indicators  Tightness    Pain Onset  More than a month ago    Pain Frequency  Intermittent                      [facility] Adult PT Treatment/Exercise - 09/25/17 0001      Self-Care   Self-Care  Other Self-Care Comments    Other Self-Care Comments   small dotted foam in piece of tg soft at lateral chest to wear inside bra       Shoulder Exercises: Sidelying   ABduction  AROM;Right;5 reps    Other Sidelying Exercises  small circles with hand pointed to ceiling.     Other Sidelying Exercises  elbow flexion and extension with elbow pointed to ceiling       Manual Therapy   Manual therapy comments  short neck. diaphragmatic breaths ( did not do abdominal series due to recent procedure and continued cramping, left axillary nodes, anterior interaxillary anastamosis, right inguinal nodes and right axillo-inguinal anastamosis along lateral trunk. then to sidelying  for posterior interaxillary anastamosis and back with extra time spent at lateral trunk     Passive ROM  in supine for right shoulder flexion and abuction with cording noted in axilla              PT Education - 09/25/17 1155    Education provided  Yes    Education Details  removed foam pad at any time if uncomfortable     Person(s) Educated  Patient    Methods  Explanation    Comprehension  Verbalized understanding           Breast Clinic Goals - 04/04/17 1313      Patient will be able to verbalize understanding of pertinent lymphedema risk reduction practices relevant to her diagnosis specifically related to skin care.   Time  1    Period  Days    Status  Achieved      Patient will be able to return demonstrate and/or verbalize understanding of the post-op home exercise program related to regaining shoulder range of motion.   Time  1    Period  Days    Status   Achieved      Patient will be able to verbalize understanding of the importance of attending the postoperative After Breast Cancer Class for further lymphedema risk reduction education and therapeutic exercise.   Time  1    Period  Days    Status  Achieved       Long Term Clinic Goals - 09/18/17 1654      CC Long Term Goal  #1   Title  Pt will report a 65% improvement in right lateral trunk tightness to allow for decreased discomfort    Time  4    Period  Weeks    Status  New    Target Date  10/16/17      CC Long Term Goal  #2   Title  Pt will report a 75% improvement in pain in right breast to allow improved function    Time  4    Period  Weeks    Status  New    Target Date  10/16/17      CC Long Term Goal  #3   Title  Pt to demonstrate 160 degrees of bilateral shoulder flexion to allow pt to reach overhead.    Baseline  R 143, L 154    Time  4    Period  Weeks    Status  New    Target Date  10/16/17      CC Long Term Goal  #4   Title  pt to demosntrate 170 degrees of right shoulder abduction to allow pt to reach out to side    Baseline  143    Time  4    Period  Weeks    Status  New    Target Date  10/16/17      CC Long Term Goal  #5   Title  Pt to be independent in a home exercise program for continued strengthening and stretching.     Time  4    Period  Weeks    Status  New         Plan - 09/25/17 1156    Clinical Impression Statement  Pt uncomfortable today due to cramping from procedure and pain from fullness at lateral chest.  Tried MLD and compression patch today to see if they would make a difference in symptoms.  Pt  noted to have thick band of axillary cording going to anterio chest limiting shoulder  range of motion    Rehab Potential  Good    Clinical Impairments Affecting Rehab Potential  hx of radiation, pt undergoing hysteroscopy on 09/21/17    PT Frequency  2x / week    PT Duration  4 weeks    PT Next Visit Plan  Assess effect of MLD and  compression patch and incorportate as part of treatment if helpful Progress gentle AA/A/PROM to right shoulder, assess indep with supine dowel abduction, pulleys, ball. manual techniques to cording        Patient will benefit from skilled therapeutic intervention in order to improve the following deficits and impairments:  Increased fascial restricitons, Pain, Postural dysfunction, Decreased scar mobility, Decreased strength, Decreased range of motion, Increased edema  Visit Diagnosis: Stiffness of right shoulder, not elsewhere classified  Acute pain of right shoulder  Muscle weakness (generalized)  Localized edema  Abnormal posture  Disorder of the skin and subcutaneous tissue related to radiation, unspecified     Problem List Patient Active Problem List   Diagnosis Date Noted  . Malignant neoplasm of upper-outer quadrant of right breast in female, estrogen receptor positive ([place]) 04/03/2017  . Thrombosed external hemorrhoid 12/04/2011  . VVKPQAES([id number] Heinz. Owens Shark PT  Norwood Levo 09/25/2017, 12:01 PM  [place], [place], [address] Phone: [phone]   Fax:  [phone]  Name: Brandi Lewis MRN: [id number] Date of Birth: May 02, 1966

## 2017-09-26 ENCOUNTER — Ambulatory Visit: Payer: BLUE CROSS/BLUE SHIELD | Admitting: Physical Therapy

## 2017-09-26 ENCOUNTER — Encounter: Payer: Self-pay | Admitting: Physical Therapy

## 2017-09-26 DIAGNOSIS — R600 Localized edema: Secondary | ICD-10-CM

## 2017-09-26 DIAGNOSIS — M25611 Stiffness of right shoulder, not elsewhere classified: Secondary | ICD-10-CM

## 2017-09-26 DIAGNOSIS — M25511 Pain in right shoulder: Secondary | ICD-10-CM

## 2017-09-26 NOTE — Therapy (Signed)
[place], [place], [address] Phone: [phone]   Fax:  [phone]  Physical Therapy Treatment  Patient Details  Name: Brandi Lewis MRN: [id number] Date of Birth: Apr 20, 1966 Referring Provider: Dr. Erroll Luna   Encounter Date: 09/26/2017  PT End of Session - 09/26/17 1605    Visit Number  3    Number of Visits  9    Date for PT Re-Evaluation  10/16/17    PT Start Time  1521    PT Stop Time  1600    PT Time Calculation (min)  39 min    Activity Tolerance  Patient tolerated treatment well    Behavior During Therapy  [facility] for tasks assessed/performed       Past Medical History:  Diagnosis Date  . Allergy    codeine  . Breast cancer ([place]) 03/26/2017 bx   right breast   . H/O partial thyroidectomy 2006   nodule  . Headache(784.0)    migraine  . Thrombosed hemorrhoids     Past Surgical History:  Procedure Laterality Date  . BREAST BIOPSY  12/08/2005  . BREAST CYST EXCISION  2007   rt breast  . BREAST EXCISIONAL BIOPSY Right 03/30/2010  . BREAST EXCISIONAL BIOPSY Right 1984  . THYROID SURGERY  11/02/2005   thyroid exploration with isthmusectomy    There were no vitals filed for this visit.  Subjective Assessment - 09/26/17 1522    Subjective  The massage and foam chip pack helped. I am still having the pain in my side.     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 It measures 1.4 cm and is located in the upper outer quadrant. It is ER/PR positive and HER2 negative with a Ki67 of 15%. Had a right lumpectomy on 04/30/17 and completed radiation on 07/18/17. Will undergo a hysteroscopy on 09/21/17 to biopsy some polyps    Patient Stated Goals  to get full ROM of RUE and decrease pain    Currently in Pain?  Yes    Pain Score  5     Pain Location  -- lateral trunk    Pain Orientation  Right                       [facility] Adult PT Treatment/Exercise - 09/26/17 0001      Shoulder Exercises: Pulleys   Flexion  2 minutes    ABduction  2 minutes      Shoulder Exercises: Therapy Ball   Flexion  10 reps    ABduction  10 reps      Manual Therapy   Manual therapy comments  short neck. diaphragmatic breaths ( did not do abdominal series due to recent procedure and continued cramping, left axillary nodes, anterior interaxillary anastamosis, right inguinal nodes and right axillo-inguinal anastamosis along lateral trunk. then to sidelying for posterior interaxillary anastamosis and back with extra time spent at lateral trunk     Passive ROM  in supine for right shoulder flexion and abuction with cording noted in axilla              PT Education - 09/25/17 1155    Education provided  Yes    Education Details  removed foam pad at any time if uncomfortable     Person(s) Educated  Patient    Methods  Explanation    Comprehension  Verbalized understanding  Breast Clinic Goals - 04/04/17 1313      Patient will be able to verbalize understanding of pertinent lymphedema risk reduction practices relevant to her diagnosis specifically related to skin care.   Time  1    Period  Days    Status  Achieved      Patient will be able to return demonstrate and/or verbalize understanding of the post-op home exercise program related to regaining shoulder range of motion.   Time  1    Period  Days    Status  Achieved      Patient will be able to verbalize understanding of the importance of attending the postoperative After Breast Cancer Class for further lymphedema risk reduction education and therapeutic exercise.   Time  1    Period  Days    Status  Achieved       Long Term Clinic Goals - 09/18/17 1654      CC Long Term Goal  #1   Title  Pt will report a 65% improvement in right lateral trunk tightness to allow for decreased discomfort    Time  4    Period   Weeks    Status  New    Target Date  10/16/17      CC Long Term Goal  #2   Title  Pt will report a 75% improvement in pain in right breast to allow improved function    Time  4    Period  Weeks    Status  New    Target Date  10/16/17      CC Long Term Goal  #3   Title  Pt to demonstrate 160 degrees of bilateral shoulder flexion to allow pt to reach overhead.    Baseline  R 143, L 154    Time  4    Period  Weeks    Status  New    Target Date  10/16/17      CC Long Term Goal  #4   Title  pt to demosntrate 170 degrees of right shoulder abduction to allow pt to reach out to side    Baseline  143    Time  4    Period  Weeks    Status  New    Target Date  10/16/17      CC Long Term Goal  #5   Title  Pt to be independent in a home exercise program for continued strengthening and stretching.     Time  4    Period  Weeks    Status  New         Plan - 09/26/17 1606    Clinical Impression Statement  Pt is still having some discomfort from her procedure. She states the MLD last session helped with R lateral trunk discomfort. Continued drainage today in this area to help improve comfort. Pt states she has heard some of her cords release when she is exercising. She still has 1 palpable cord in her right axilla. Continued with PROM to R shoulder today.     Rehab Potential  Good    Clinical Impairments Affecting Rehab Potential  hx of radiation, pt undergoing hysteroscopy on 09/21/17    PT Frequency  2x / week    PT Duration  4 weeks    PT Treatment/Interventions  ADLs/Self Care Home Management;Therapeutic exercise;Therapeutic activities;Patient/family education;Orthotic Fit/Training;Manual lymph drainage;Manual techniques;Scar mobilization;Passive range of motion;Taping    PT Next Visit Plan  Continue MLD if helpful,  Progress gentle AA/A/PROM to right shoulder, assess indep with supine dowel abduction, pulleys, ball. manual techniques to cording     PT Home Exercise Plan  post op breast  HEP, supine dowel ex    Consulted and Agree with Plan of Care  Patient       Patient will benefit from skilled therapeutic intervention in order to improve the following deficits and impairments:  Increased fascial restricitons, Pain, Postural dysfunction, Decreased scar mobility, Decreased strength, Decreased range of motion, Increased edema  Visit Diagnosis: Stiffness of right shoulder, not elsewhere classified  Acute pain of right shoulder  Localized edema     Problem List Patient Active Problem List   Diagnosis Date Noted  . Malignant neoplasm of upper-outer quadrant of right breast in female, estrogen receptor positive ([place]) 04/03/2017  . Thrombosed external hemorrhoid 12/04/2011  . [id number](854.8Manus Gunning 09/26/2017, 4:09 PM  [place], [place], [address] Phone: [phone]   Fax:  [phone]  Name: MACHELL WIRTHLIN MRN: [id number] Date of Birth: January 13, 1966  Manus Gunning, PT 09/26/17 4:10 PM

## 2017-09-28 ENCOUNTER — Ambulatory Visit: Payer: BLUE CROSS/BLUE SHIELD | Admitting: Physical Therapy

## 2017-09-28 ENCOUNTER — Encounter: Payer: Self-pay | Admitting: Physical Therapy

## 2017-09-28 DIAGNOSIS — R293 Abnormal posture: Secondary | ICD-10-CM

## 2017-09-28 DIAGNOSIS — M6281 Muscle weakness (generalized): Secondary | ICD-10-CM

## 2017-09-28 DIAGNOSIS — M25611 Stiffness of right shoulder, not elsewhere classified: Secondary | ICD-10-CM

## 2017-09-28 DIAGNOSIS — M25511 Pain in right shoulder: Secondary | ICD-10-CM

## 2017-09-28 NOTE — Therapy (Signed)
[place], [place], [address] Phone: [phone]   Fax:  [phone]  Physical Therapy Treatment  Patient Details  Name: Brandi Lewis MRN: [id number] Date of Birth: 08-30-1966 Referring Provider: Dr. Erroll Luna   Encounter Date: 09/28/2017  PT End of Session - 09/28/17 0831    Visit Number  4    Number of Visits  9    Date for PT Re-Evaluation  10/16/17    PT Start Time  0820 pt arrived late due to traffic    PT Stop Time  0847    PT Time Calculation (min)  27 min    Activity Tolerance  Patient tolerated treatment well    Behavior During Therapy  [facility] for tasks assessed/performed       Past Medical History:  Diagnosis Date  . Allergy    codeine  . Breast cancer ([place]) 03/26/2017 bx   right breast   . H/O partial thyroidectomy 2006   nodule  . Headache(784.0)    migraine  . Thrombosed hemorrhoids     Past Surgical History:  Procedure Laterality Date  . BREAST BIOPSY  12/08/2005  . BREAST CYST EXCISION  2007   rt breast  . BREAST EXCISIONAL BIOPSY Right 03/30/2010  . BREAST EXCISIONAL BIOPSY Right 1984  . BREAST LUMPECTOMY WITH RADIOACTIVE SEED AND SENTINEL LYMPH NODE BIOPSY Right 04/30/2017   Procedure: BREAST LUMPECTOMY WITH RADIOACTIVE SEED AND AXILLARY SENTINEL LYMPH NODE BIOPSY;  Surgeon: Erroll Luna, MD;  Location: [facility];  Service: General;  Laterality: Right;  . DILATATION & CURETTAGE/HYSTEROSCOPY WITH MYOSURE N/A 09/21/2017   Procedure: [place];  Surgeon: Servando Salina, MD;  Location: [facility];  Service: Gynecology;  Laterality: N/A;  . THYROID SURGERY  11/02/2005   thyroid exploration with isthmusectomy    There were no vitals filed for this visit.  Subjective Assessment - 09/28/17 0821    Subjective  I felt good after last time. My pain is getting better.     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 It measures 1.4 cm and is located in the upper outer quadrant. It is ER/PR positive and HER2 negative with a Ki67 of 15%. Had a right lumpectomy on 04/30/17 and completed radiation on 07/18/17. Will undergo a hysteroscopy on 09/21/17 to biopsy some polyps    Patient Stated Goals  to get full ROM of RUE and decrease pain    Currently in Pain?  Yes    Pain Score  4     Pain Location  -- lateral trunk    Pain Orientation  Right                      [facility] Adult PT Treatment/Exercise - 09/28/17 0001      Shoulder Exercises: Supine   Horizontal ABduction  Strengthening;Both;10 reps;Theraband    Theraband Level (Shoulder Horizontal ABduction)  Level 1 (Yellow)    External Rotation  Strengthening;Both;10 reps;Theraband    Theraband Level (Shoulder External Rotation)  Level 1 (Yellow)    Flexion  Strengthening;Both;10 reps;Theraband narrow and wide    Theraband Level (Shoulder Flexion)  Level 1 (Yellow)    Other Supine Exercises  D2 x 10 reps with yellow theraband      Shoulder Exercises: Pulleys   Flexion  2 minutes    ABduction  2 minutes  Shoulder Exercises: Therapy Ball   Flexion  10 reps      Manual Therapy   Passive ROM  very briefly to R shoulder into flexion                   Breast Clinic Goals - 04/04/17 1313      Patient will be able to verbalize understanding of pertinent lymphedema risk reduction practices relevant to her diagnosis specifically related to skin care.   Time  1    Period  Days    Status  Achieved      Patient will be able to return demonstrate and/or verbalize understanding of the post-op home exercise program related to regaining shoulder range of motion.   Time  1    Period  Days    Status  Achieved      Patient will be able to verbalize understanding of the importance of attending the postoperative After Breast Cancer Class for further lymphedema  risk reduction education and therapeutic exercise.   Time  1    Period  Days    Status  Achieved       Long Term Clinic Goals - 09/28/17 [phone]      CC Long Term Goal  #1   Title  Pt will report a 65% improvement in right lateral trunk tightness to allow for decreased discomfort    Baseline  09/28/17- 40% improved    Time  4    Period  Weeks    Status  On-going      CC Long Term Goal  #2   Title  Pt will report a 75% improvement in pain in right breast to allow improved function    Baseline  09/28/17- 30% improvement    Time  4    Period  Weeks    Status  On-going      CC Long Term Goal  #3   Title  Pt to demonstrate 160 degrees of bilateral shoulder flexion to allow pt to reach overhead.    Baseline  R 143, L 154, 09/28/17- R 148, L 160    Time  4    Period  Weeks    Status  On-going      CC Long Term Goal  #4   Title  pt to demosntrate 170 degrees of right shoulder abduction to allow pt to reach out to side    Baseline  143, 09/28/17- R 170 degrees    Time  4    Period  Weeks    Status  Achieved      CC Long Term Goal  #5   Title  Pt to be independent in a home exercise program for continued strengthening and stretching.     Time  4    Period  Weeks    Status  On-going         Plan - 09/28/17 4163    Clinical Impression Statement  Assessed pt's progress towards goals in therapy. Pt has met her abduction goal for her right shoulder. She is progressing towards all other goals in therapy. She is having less pain and tightness. Instructed pt in supine scapular series today and added this to pt's home exercise program.     Clinical Impairments Affecting Rehab Potential  hx of radiation, pt undergoing hysteroscopy on 09/21/17    PT Frequency  2x / week    PT Duration  4 weeks    PT Treatment/Interventions  ADLs/Self Care Home Management;Therapeutic  exercise;Therapeutic activities;Patient/family education;Orthotic Fit/Training;Manual lymph drainage;Manual techniques;Scar  mobilization;Passive range of motion;Taping    PT Next Visit Plan  Continue MLD if helpful, Progress gentle AA/A/PROM to right shoulder, assess indep with supine scap series, pulleys, ball. manual techniques to cording     PT Home Exercise Plan  post op breast HEP, supine dowel ex, supine scap series    Consulted and Agree with Plan of Care  Patient       Patient will benefit from skilled therapeutic intervention in order to improve the following deficits and impairments:  Increased fascial restricitons, Pain, Postural dysfunction, Decreased scar mobility, Decreased strength, Decreased range of motion, Increased edema  Visit Diagnosis: Stiffness of right shoulder, not elsewhere classified  Acute pain of right shoulder  Muscle weakness (generalized)  Abnormal posture     Problem List Patient Active Problem List   Diagnosis Date Noted  . Malignant neoplasm of upper-outer quadrant of right breast in female, estrogen receptor positive ([place]) 04/03/2017  . Thrombosed external hemorrhoid 12/04/2011  . UVQQUIVH(464.3Allyson Sabal [facility] 09/28/2017, 8:48 AM  [place], [place], [address] Phone: [phone]   Fax:  [phone]  Name: Brandi Lewis MRN: [id number] Date of Birth: [birth year]  Manus Gunning, PT 09/28/17 8:48 AM

## 2017-09-28 NOTE — Patient Instructions (Signed)

## 2017-10-02 ENCOUNTER — Encounter: Payer: Self-pay | Admitting: Physical Therapy

## 2017-10-02 ENCOUNTER — Ambulatory Visit: Payer: BLUE CROSS/BLUE SHIELD | Admitting: Physical Therapy

## 2017-10-02 DIAGNOSIS — M6281 Muscle weakness (generalized): Secondary | ICD-10-CM

## 2017-10-02 DIAGNOSIS — M25611 Stiffness of right shoulder, not elsewhere classified: Secondary | ICD-10-CM | POA: Diagnosis not present

## 2017-10-02 DIAGNOSIS — M25511 Pain in right shoulder: Secondary | ICD-10-CM

## 2017-10-02 DIAGNOSIS — R293 Abnormal posture: Secondary | ICD-10-CM

## 2017-10-02 NOTE — Therapy (Signed)
[place], [place], [address] Phone: [phone]   Fax:  [phone]  Physical Therapy Treatment  Patient Details  Name: Brandi Lewis MRN: [id number] Date of Birth: 03-17-1966 Referring Provider: Dr. Erroll Luna   Encounter Date: 10/02/2017  PT End of Session - 10/02/17 1700    Visit Number  5    Number of Visits  9    Date for PT Re-Evaluation  10/16/17    PT Start Time  1524    PT Stop Time  1604    PT Time Calculation (min)  40 min    Activity Tolerance  Patient tolerated treatment well    Behavior During Therapy  [facility] for tasks assessed/performed       Past Medical History:  Diagnosis Date  . Allergy    codeine  . Breast cancer ([place]) 03/26/2017 bx   right breast   . H/O partial thyroidectomy 2006   nodule  . Headache(784.0)    migraine  . Thrombosed hemorrhoids     Past Surgical History:  Procedure Laterality Date  . BREAST BIOPSY  12/08/2005  . BREAST CYST EXCISION  2007   rt breast  . BREAST EXCISIONAL BIOPSY Right 03/30/2010  . BREAST EXCISIONAL BIOPSY Right 1984  . BREAST LUMPECTOMY WITH RADIOACTIVE SEED AND SENTINEL LYMPH NODE BIOPSY Right 04/30/2017   Procedure: BREAST LUMPECTOMY WITH RADIOACTIVE SEED AND AXILLARY SENTINEL LYMPH NODE BIOPSY;  Surgeon: Erroll Luna, MD;  Location: [facility];  Service: General;  Laterality: Right;  . DILATATION & CURETTAGE/HYSTEROSCOPY WITH MYOSURE N/A 09/21/2017   Procedure: [place];  Surgeon: Servando Salina, MD;  Location: [facility];  Service: Gynecology;  Laterality: N/A;  . THYROID SURGERY  11/02/2005   thyroid exploration with isthmusectomy    There were no vitals filed for this visit.  Subjective Assessment - 10/02/17 1526    Subjective  My shoulder is a little sore today. It was a little sore after stretching last time.     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 It measures 1.4 cm and is located in the upper outer quadrant. It is ER/PR positive and HER2 negative with a Ki67 of 15%. Had a right lumpectomy on 04/30/17 and completed radiation on 07/18/17. Will undergo a hysteroscopy on 09/21/17 to biopsy some polyps    Patient Stated Goals  to get full ROM of RUE and decrease pain    Currently in Pain?  Yes    Pain Score  5     Pain Location  Shoulder    Pain Orientation  Right    Pain Descriptors / Indicators  Sore;Tightness    Pain Type  Surgical pain                      [facility] Adult PT Treatment/Exercise - 10/02/17 0001      Shoulder Exercises: Supine   Horizontal ABduction  Strengthening;Both;10 reps;Theraband    Theraband Level (Shoulder Horizontal ABduction)  Level 1 (Yellow)    External Rotation  Strengthening;Both;10 reps;Theraband    Theraband Level (Shoulder External Rotation)  Level 1 (Yellow)    Flexion  Strengthening;Both;10 reps;Theraband narrow and wide    Theraband Level (Shoulder Flexion)  Level 1 (Yellow)    Other Supine Exercises  D2 x 10 reps with yellow theraband      Shoulder Exercises: Pulleys   Flexion  2 minutes    ABduction  2 minutes      Shoulder Exercises: Therapy Ball   Flexion  10 reps    ABduction  10 reps      Manual Therapy   Passive ROM  to right shoulder in direction of flexion and abduction                   Breast Clinic Goals - 04/04/17 1313      Patient will be able to verbalize understanding of pertinent lymphedema risk reduction practices relevant to her diagnosis specifically related to skin care.   Time  1    Period  Days    Status  Achieved      Patient will be able to return demonstrate and/or verbalize understanding of the post-op home exercise program related to regaining shoulder range of motion.   Time  1    Period  Days    Status  Achieved      Patient will be able to verbalize  understanding of the importance of attending the postoperative After Breast Cancer Class for further lymphedema risk reduction education and therapeutic exercise.   Time  1    Period  Days    Status  Achieved       Long Term Clinic Goals - 09/28/17 [phone]      CC Long Term Goal  #1   Title  Pt will report a 65% improvement in right lateral trunk tightness to allow for decreased discomfort    Baseline  09/28/17- 40% improved    Time  4    Period  Weeks    Status  On-going      CC Long Term Goal  #2   Title  Pt will report a 75% improvement in pain in right breast to allow improved function    Baseline  09/28/17- 30% improvement    Time  4    Period  Weeks    Status  On-going      CC Long Term Goal  #3   Title  Pt to demonstrate 160 degrees of bilateral shoulder flexion to allow pt to reach overhead.    Baseline  R 143, L 154, 09/28/17- R 148, L 160    Time  4    Period  Weeks    Status  On-going      CC Long Term Goal  #4   Title  pt to demosntrate 170 degrees of right shoulder abduction to allow pt to reach out to side    Baseline  143, 09/28/17- R 170 degrees    Time  4    Period  Weeks    Status  Achieved      CC Long Term Goal  #5   Title  Pt to be independent in a home exercise program for continued strengthening and stretching.     Time  4    Period  Weeks    Status  On-going         Plan - 10/02/17 1700    Clinical Impression Statement  Pt continues to progress with ROM in therapy. Continued supine scapular exercises because pt was not sure she was doing one of them correctly. Will progress to red band next session if pt does not have any increase in soreness. Continued with PROM with ROM gained by end of session per visual estimate.     Rehab Potential  Good    Clinical Impairments Affecting Rehab Potential  hx of radiation, pt undergoing hysteroscopy on 09/21/17    PT Frequency  2x / week    PT Treatment/Interventions  ADLs/Self Care Home  Management;Therapeutic exercise;Therapeutic activities;Patient/family education;Orthotic Fit/Training;Manual lymph drainage;Manual techniques;Scar mobilization;Passive range of motion;Taping    PT Next Visit Plan  Continue MLD if helpful, Progress gentle AA/A/PROM to right shoulder, assess indep with supine scap series increase to red band, pulleys, ball. manual techniques to cording     PT Home Exercise Plan  post op breast HEP, supine dowel ex, supine scap series    Consulted and Agree with Plan of Care  Patient       Patient will benefit from skilled therapeutic intervention in order to improve the following deficits and impairments:  Increased fascial restricitons, Pain, Postural dysfunction, Decreased scar mobility, Decreased strength, Decreased range of motion, Increased edema  Visit Diagnosis: Stiffness of right shoulder, not elsewhere classified  Acute pain of right shoulder  Muscle weakness (generalized)  Abnormal posture     Problem List Patient Active Problem List   Diagnosis Date Noted  . Malignant neoplasm of upper-outer quadrant of right breast in female, estrogen receptor positive ([place]) 04/03/2017  . Thrombosed external hemorrhoid 12/04/2011  . [id number]([id number] Gunning 10/02/2017, [place], [place], [address] Phone: [phone]   Fax:  [phone]  Name: Brandi Lewis MRN: [id number] Date of Birth: 04/14/1966  Manus Gunning, PT 10/02/17 5:02 PM

## 2017-10-08 ENCOUNTER — Ambulatory Visit: Payer: BLUE CROSS/BLUE SHIELD

## 2017-10-08 DIAGNOSIS — M6281 Muscle weakness (generalized): Secondary | ICD-10-CM

## 2017-10-08 DIAGNOSIS — L599 Disorder of the skin and subcutaneous tissue related to radiation, unspecified: Secondary | ICD-10-CM

## 2017-10-08 DIAGNOSIS — M25511 Pain in right shoulder: Secondary | ICD-10-CM

## 2017-10-08 DIAGNOSIS — R600 Localized edema: Secondary | ICD-10-CM

## 2017-10-08 DIAGNOSIS — M25611 Stiffness of right shoulder, not elsewhere classified: Secondary | ICD-10-CM | POA: Diagnosis not present

## 2017-10-08 DIAGNOSIS — R293 Abnormal posture: Secondary | ICD-10-CM

## 2017-10-08 NOTE — Therapy (Signed)
[place], [place], [address] Phone: [phone]   Fax:  [phone]  Physical Therapy Treatment  Patient Details  Name: Brandi Lewis MRN: [id number] Date of Birth: October 21, 1966 Referring Provider: Dr. Erroll Luna   Encounter Date: 10/08/2017  PT End of Session - 10/08/17 1200    Visit Number  6    Number of Visits  9    Date for PT Re-Evaluation  10/16/17    PT Start Time  1107    PT Stop Time  1154    PT Time Calculation (min)  47 min    Activity Tolerance  Patient tolerated treatment well    Behavior During Therapy  [facility] for tasks assessed/performed       Past Medical History:  Diagnosis Date  . Allergy    codeine  . Breast cancer ([place]) 03/26/2017 bx   right breast   . H/O partial thyroidectomy 2006   nodule  . Headache(784.0)    migraine  . Thrombosed hemorrhoids     Past Surgical History:  Procedure Laterality Date  . BREAST BIOPSY  12/08/2005  . BREAST CYST EXCISION  2007   rt breast  . BREAST EXCISIONAL BIOPSY Right 03/30/2010  . BREAST EXCISIONAL BIOPSY Right 1984  . BREAST LUMPECTOMY WITH RADIOACTIVE SEED AND SENTINEL LYMPH NODE BIOPSY Right 04/30/2017   Procedure: BREAST LUMPECTOMY WITH RADIOACTIVE SEED AND AXILLARY SENTINEL LYMPH NODE BIOPSY;  Surgeon: Erroll Luna, MD;  Location: [facility];  Service: General;  Laterality: Right;  . DILATATION & CURETTAGE/HYSTEROSCOPY WITH MYOSURE N/A 09/21/2017   Procedure: [place];  Surgeon: Servando Salina, MD;  Location: [facility];  Service: Gynecology;  Laterality: N/A;  . THYROID SURGERY  11/02/2005   thyroid exploration with isthmusectomy    There were no vitals filed for this visit.  Subjective Assessment - 10/08/17 1110    Subjective  I feltgood after last session and I feel like the swelling is a little better.     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 It measures 1.4 cm and is located in the upper outer quadrant. It is ER/PR positive and HER2 negative with a Ki67 of 15%. Had a right lumpectomy on 04/30/17 and completed radiation on 07/18/17. Will undergo a hysteroscopy on 09/21/17 to biopsy some polyps    Patient Stated Goals  to get full ROM of RUE and decrease pain    Currently in Pain?  Yes    Pain Score  5     Pain Location  Flank    Pain Orientation  Right    Pain Descriptors / Indicators  Sharp    Pain Type  Surgical pain    Pain Onset  More than a month ago    Aggravating Factors   just always there    Pain Relieving Factors  exercises and the manual lymph drainage                      [facility] Adult PT Treatment/Exercise - 10/08/17 0001      Shoulder Exercises: Pulleys   Flexion  2 minutes    ABduction  2 minutes      Manual Therapy   Manual Therapy  Myofascial release;Manual Lymphatic Drainage (MLD);Passive ROM    Myofascial Release  To Rt axilla during P/ROM at cording in axilla    Manual Lymphatic Drainage (  MLD)  In Supine and instructing pt throughout while performing thenhaving her return demonstration: Short neck, 5 diaphramagtic breaths; Rt inguinal and Lt axillary nodes, Rt axillo-inguinal and anterior inter-axillary anastomosis, then focused on lateral breast area where pt c/o swelling the most, then into Lt S/L for posterior inter-axilary anastomosis and futher work along [place] anastomosis.     Passive ROM  to right shoulder in direction of flexion and abduction             PT Education - 10/08/17 1159    Education provided  Yes    Education Details  Self manual lymph drainage     Person(s) Educated  Patient    Methods  Explanation;Demonstration;Handout    Comprehension  Verbalized understanding;Returned demonstration;Need further instruction          [facility] Goals - 09/28/17 [phone]      CC Long  Term Goal  #1   Title  Pt will report a 65% improvement in right lateral trunk tightness to allow for decreased discomfort    Baseline  09/28/17- 40% improved    Time  4    Period  Weeks    Status  On-going      CC Long Term Goal  #2   Title  Pt will report a 75% improvement in pain in right breast to allow improved function    Baseline  09/28/17- 30% improvement    Time  4    Period  Weeks    Status  On-going      CC Long Term Goal  #3   Title  Pt to demonstrate 160 degrees of bilateral shoulder flexion to allow pt to reach overhead.    Baseline  R 143, L 154, 09/28/17- R 148, L 160    Time  4    Period  Weeks    Status  On-going      CC Long Term Goal  #4   Title  pt to demosntrate 170 degrees of right shoulder abduction to allow pt to reach out to side    Baseline  143, 09/28/17- R 170 degrees    Time  4    Period  Weeks    Status  Achieved      CC Long Term Goal  #5   Title  Pt to be independent in a home exercise program for continued strengthening and stretching.     Time  4    Period  Weeks    Status  On-going         Plan - 10/08/17 1201    Clinical Impression Statement  Pt reports her swelling, though still present, has improved. She feels the manual lymph drainage has helped with this alot so instructed pt in this today for her to cont at home. She did very well with inital instruction of basics of anatomy of lymphatic system and was able to return good demonstration of techique. Her cording is still present, limiting her ROM significantly, though pt reports this has also imrpoved and isn't as painful as it's been in the past. Issued red theraband for pt to progress to with supine scapular series at home (she reports not needing review of this today).    Rehab Potential  Good    Clinical Impairments Affecting Rehab Potential  hx of radiation, pt undergoing hysteroscopy on 09/21/17    PT Frequency  2x / week    PT Duration  4 weeks    PT Treatment/Interventions  ADLs/Self Care Home Management;Therapeutic exercise;Therapeutic activities;Patient/family education;Orthotic Fit/Training;Manual lymph drainage;Manual techniques;Scar mobilization;Passive range of motion;Taping    PT Next Visit Plan  Review MLD prn; Progress gentle AA/A/PROM to right shoulder pulleys, ball. manual techniques to cording     Consulted and Agree with Plan of Care  Patient       Patient will benefit from skilled therapeutic intervention in order to improve the following deficits and impairments:  Increased fascial restricitons, Pain, Postural dysfunction, Decreased scar mobility, Decreased strength, Decreased range of motion, Increased edema  Visit Diagnosis: Stiffness of right shoulder, not elsewhere classified  Acute pain of right shoulder  Muscle weakness (generalized)  Abnormal posture  Localized edema  Disorder of the skin and subcutaneous tissue related to radiation, unspecified     Problem List Patient Active Problem List   Diagnosis Date Noted  . Malignant neoplasm of upper-outer quadrant of right breast in female, estrogen receptor positive ([place]) 04/03/2017  . Thrombosed external hemorrhoid 12/04/2011  . [id number](826.4Otelia Limes, PTA 10/08/2017, 12:05 PM  [place], [place], [address] Phone: [phone]   Fax:  [phone]  Name: Brandi Lewis MRN: [id number] Date of Birth: May 02, 1966

## 2017-10-08 NOTE — Patient Instructions (Signed)
Self manual lymph drainage:     Cancer Rehab [phone] Perform this sequence once a day.  Only give enough pressure no your skin to make the skin move.  Start with circles near neck, above collarbones 10 times on each side. Diaphragmatic - Supine   Inhale through nose making navel move out toward hands. Exhale through puckered lips, hands follow navel in. Repeat _5__ times. Rest _10__ seconds between repeats.   Copyright  VHI. All rights reserved.  Hug yourself.  Do circles at your neck just above your collarbones.  Repeat this 10 times.  Axilla - One at a Time   Using full weight of flat hand and fingers at center of uninvolved armpit, make _10__ in-place circles.   Copyright  VHI. All rights reserved.  LEG: Inguinal Nodes Stimulation   With small finger side of hand against hip crease on involved side, gently perform circles at the crease. Repeat __10_ times.   Copyright  VHI. All rights reserved.  1) Axilla to Inguinal Nodes - Sweep   On involved side, sweep _4__ times from armpit along side of trunk to hip crease.  Now gently stretch skin from the involved side to the uninvolved side across the chest at the shoulder line.  Repeat that 4 times.  Draw an imaginary diagonal line from upper outer breast through the nipple area toward lower inner breast.  Direct fluid upward and inward from this line toward the pathway across your upper chest .  Do this in three rows to treat all of the upper inner breast tissue, and do each row 3-4x.      Direct fluid to treat all of lower outer breast tissue downward and outward toward   pathway that is aimed at the left groin.  Finish by doing the pathways as described above going from your involved armpit to the same side groin and going across your upper chest from the involved shoulder to the uninvolved shoulder.  Repeat the steps above where you do circles in your right groin and left armpit. Copyright  VHI. All rights reserved.

## 2017-10-10 ENCOUNTER — Telehealth: Payer: Self-pay

## 2017-10-10 ENCOUNTER — Encounter: Payer: BLUE CROSS/BLUE SHIELD | Admitting: Physical Therapy

## 2017-10-10 NOTE — Telephone Encounter (Signed)
Spoke with pt to remind of [facility] appt next week 10/18/17 at 8:30 am. Pt will come to appt.

## 2017-10-15 ENCOUNTER — Other Ambulatory Visit: Payer: Self-pay

## 2017-10-15 ENCOUNTER — Ambulatory Visit: Payer: BLUE CROSS/BLUE SHIELD | Attending: Surgery | Admitting: Physical Therapy

## 2017-10-15 ENCOUNTER — Encounter: Payer: Self-pay | Admitting: Physical Therapy

## 2017-10-15 DIAGNOSIS — R600 Localized edema: Secondary | ICD-10-CM

## 2017-10-15 DIAGNOSIS — M25511 Pain in right shoulder: Secondary | ICD-10-CM

## 2017-10-15 DIAGNOSIS — M25611 Stiffness of right shoulder, not elsewhere classified: Secondary | ICD-10-CM

## 2017-10-15 DIAGNOSIS — R293 Abnormal posture: Secondary | ICD-10-CM

## 2017-10-15 DIAGNOSIS — M6281 Muscle weakness (generalized): Secondary | ICD-10-CM

## 2017-10-15 DIAGNOSIS — L599 Disorder of the skin and subcutaneous tissue related to radiation, unspecified: Secondary | ICD-10-CM

## 2017-10-15 NOTE — Therapy (Signed)
[place], [place], [address] Phone: [phone]   Fax:  [phone]  Physical Therapy Treatment  Patient Details  Name: Brandi Lewis MRN: [id number] Date of Birth: August 05, 1966 Referring Provider: Dr. Erroll Luna   Encounter Date: 10/15/2017  PT End of Session - 10/15/17 1434    Visit Number  7    Number of Visits  17    Date for PT Re-Evaluation  11/15/17    PT Start Time  3154    PT Stop Time  1427    PT Time Calculation (min)  39 min    Activity Tolerance  Patient tolerated treatment well    Behavior During Therapy  [facility] for tasks assessed/performed       Past Medical History:  Diagnosis Date  . Allergy    codeine  . Breast cancer ([place]) 03/26/2017 bx   right breast   . H/O partial thyroidectomy 2006   nodule  . Headache(784.0)    migraine  . Thrombosed hemorrhoids     Past Surgical History:  Procedure Laterality Date  . BREAST BIOPSY  12/08/2005  . BREAST CYST EXCISION  2007   rt breast  . BREAST EXCISIONAL BIOPSY Right 03/30/2010  . BREAST EXCISIONAL BIOPSY Right 1984  . BREAST LUMPECTOMY WITH RADIOACTIVE SEED AND SENTINEL LYMPH NODE BIOPSY Right 04/30/2017   Procedure: BREAST LUMPECTOMY WITH RADIOACTIVE SEED AND AXILLARY SENTINEL LYMPH NODE BIOPSY;  Surgeon: Erroll Luna, MD;  Location: [facility];  Service: General;  Laterality: Right;  . DILATATION & CURETTAGE/HYSTEROSCOPY WITH MYOSURE N/A 09/21/2017   Procedure: [place];  Surgeon: Servando Salina, MD;  Location: [facility];  Service: Gynecology;  Laterality: N/A;  . THYROID SURGERY  11/02/2005   thyroid exploration with isthmusectomy    There were no vitals filed for this visit.  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 It measures 1.4 cm and is located in the upper outer quadrant. It is ER/PR positive and HER2 negative with a Ki67 of 15%. Had a right lumpectomy on 04/30/17 and completed radiation on 07/18/17. Will undergo a hysteroscopy on 09/21/17 to biopsy some polyps    Patient Stated Goals  to get full ROM of RUE and decrease pain    Currently in Pain?  No/denies    Pain Score  4     Pain Location  Flank    Pain Orientation  Right    Pain Descriptors / Indicators  Sharp    Pain Type  Surgical pain    Pain Radiating Towards  across chest and into armpit     Pain Onset  More than a month ago    Pain Frequency  Intermittent    Aggravating Factors   just always there     Pain Relieving Factors  exercises and manual lymph drainage          [facility] PT Assessment - 10/15/17 0001      Assessment   Medical Diagnosis  Right breast cancer    Referring Provider  Dr. [name] Cornett    Onset Date/Surgical Date  04/30/17      Prior Function   Level of Independence  Independent  AROM   Right Shoulder Flexion  150 Degrees    Right Shoulder ABduction  170 Degrees      Palpation   Palpation comment  very tight pec major muscle with distinct guitar string visible and palpable at anterior axilla especially with abduction in sidelying                   [facility] Adult PT Treatment/Exercise - 10/15/17 0001      Self-Care   Other Self-Care Comments   upgraded to large dotted foam in thin stockinette to wear inside of lateral bra       Manual Therapy   Manual Therapy  Soft tissue mobilization;Myofascial release;Manual Lymphatic Drainage (MLD)    Soft tissue mobilization  with biotone to tight pec major and tight muscles in axilla     Myofascial Release  to pec major/anterior axilla/ cording in axilla with extra needed for release     Manual Lymphatic Drainage (MLD)  briefly, deep breaths and MLD to left  anterior and lateral chest and to sidelying for posterior interaxillary anastamosis.     Passive ROM  to right shoulder in direction of flexion and abduction with stretching to axilla                    Breast Clinic Goals - 04/04/17 1313      Patient will be able to verbalize understanding of pertinent lymphedema risk reduction practices relevant to her diagnosis specifically related to skin care.   Time  1    Period  Days    Status  Achieved      Patient will be able to return demonstrate and/or verbalize understanding of the post-op home exercise program related to regaining shoulder range of motion.   Time  1    Period  Days    Status  Achieved      Patient will be able to verbalize understanding of the importance of attending the postoperative After Breast Cancer Class for further lymphedema risk reduction education and therapeutic exercise.   Time  1    Period  Days    Status  Achieved       Long Term Clinic Goals - 10/15/17 1439      CC Long Term Goal  #1   Title  Pt will report a 65% improvement in right lateral trunk tightness to allow for decreased discomfort    Baseline  09/28/17- 40% improved    Time  4    Period  Weeks    Status  On-going      CC Long Term Goal  #2   Title  Pt will report a 75% improvement in pain in right breast to allow improved function    Baseline  09/28/17- 30% improvement    Time  4    Period  Weeks    Status  On-going      CC Long Term Goal  #3   Title  Pt to demonstrate 160 degrees of bilateral shoulder flexion to allow pt to reach overhead.    Baseline  R 143, L 154, 09/28/17- R 148, L 160, 10/15/17/ R150. L 160     Time  4    Period  Weeks    Status  On-going      CC Long Term Goal  #4   Title  pt to demosntrate 170 degrees of right shoulder abduction to allow pt to reach out to side    Baseline  143,  09/28/17- R 170 degrees    Status  Achieved      CC Long Term Goal  #5   Title  Pt to be independent in a home  exercise program for continued strengthening and stretching.     Time  4    Period  Weeks    Status  On-going         Plan - 10/15/17 1435    Clinical Impression Statement  Pt feels that she has had improvement in swelling at lateral chest.  She has improved in active shoulder flexion also, but she still has pain and tightness in axilla and anterior chest with axillary cording including area of 'guitar string" in anterior axilla near pec major tendon. Upgraded foam patch today to see if it will assistin futher decrease in fibrosis and swelling reduction.  She will need continued PT to help with decrease cording and decrease shoulder pain and tightness     Rehab Potential  Good    Clinical Impairments Affecting Rehab Potential  hx of radiation, pt undergoing hysteroscopy on 09/21/17    PT Duration  4 weeks    PT Treatment/Interventions  ADLs/Self Care Home Management;Therapeutic exercise;Therapeutic activities;Patient/family education;Orthotic Fit/Training;Manual lymph drainage;Manual techniques;Scar mobilization;Passive range of motion;Taping    PT Next Visit Plan  Review MLD prn; Progress gentle AA/A/PROM to right shoulder pulleys, ball. with progression of home exercise program.  Focus on manual techniques to cording     PT Home Exercise Plan  post op breast HEP, supine dowel ex, supine scap series    Consulted and Agree with Plan of Care  Patient       Patient will benefit from skilled therapeutic intervention in order to improve the following deficits and impairments:  Increased fascial restricitons, Pain, Postural dysfunction, Decreased scar mobility, Decreased strength, Decreased range of motion, Increased edema  Visit Diagnosis: Stiffness of right shoulder, not elsewhere classified - Plan: PT plan of care cert/re-cert  Acute pain of right shoulder - Plan: PT plan of care cert/re-cert  Abnormal posture - Plan: PT plan of care cert/re-cert  Muscle weakness (generalized) - Plan: PT  plan of care cert/re-cert  Localized edema - Plan: PT plan of care cert/re-cert  Disorder of the skin and subcutaneous tissue related to radiation, unspecified - Plan: PT plan of care cert/re-cert     Problem List Patient Active Problem List   Diagnosis Date Noted  . Malignant neoplasm of upper-outer quadrant of right breast in female, estrogen receptor positive ([place]) 04/03/2017  . Thrombosed external hemorrhoid 12/04/2011  . DXAJOINO(676.7Donato Heinz. Owens Shark PT  Norwood Levo 10/15/2017, 4:07 PM  [place], [place], [address] Phone: [phone]   Fax:  [phone]  Name: Brandi Lewis MRN: [id number] Date of Birth: 08-08-1966

## 2017-10-17 ENCOUNTER — Ambulatory Visit: Payer: BLUE CROSS/BLUE SHIELD

## 2017-10-17 DIAGNOSIS — R293 Abnormal posture: Secondary | ICD-10-CM

## 2017-10-17 DIAGNOSIS — M25511 Pain in right shoulder: Secondary | ICD-10-CM

## 2017-10-17 DIAGNOSIS — R600 Localized edema: Secondary | ICD-10-CM

## 2017-10-17 DIAGNOSIS — M25611 Stiffness of right shoulder, not elsewhere classified: Secondary | ICD-10-CM | POA: Diagnosis not present

## 2017-10-17 DIAGNOSIS — L599 Disorder of the skin and subcutaneous tissue related to radiation, unspecified: Secondary | ICD-10-CM

## 2017-10-17 DIAGNOSIS — M6281 Muscle weakness (generalized): Secondary | ICD-10-CM

## 2017-10-17 NOTE — Therapy (Signed)
[place], [place], [address] Phone: [phone]   Fax:  [phone]  Physical Therapy Treatment  Patient Details  Name: Brandi Lewis MRN: [id number] Date of Birth: 31-Mar-1966 Referring Provider: Dr. Erroll Luna   Encounter Date: 10/17/2017  PT End of Session - 10/17/17 1407    Visit Number  8    Number of Visits  17    Date for PT Re-Evaluation  11/15/17    PT Start Time  1351    PT Stop Time  1432    PT Time Calculation (min)  41 min    Activity Tolerance  Patient tolerated treatment well    Behavior During Therapy  [facility] for tasks assessed/performed       Past Medical History:  Diagnosis Date  . Allergy    codeine  . Breast cancer ([place]) 03/26/2017 bx   right breast   . H/O partial thyroidectomy 2006   nodule  . Headache(784.0)    migraine  . Thrombosed hemorrhoids     Past Surgical History:  Procedure Laterality Date  . BREAST BIOPSY  12/08/2005  . BREAST CYST EXCISION  2007   rt breast  . BREAST EXCISIONAL BIOPSY Right 03/30/2010  . BREAST EXCISIONAL BIOPSY Right 1984  . BREAST LUMPECTOMY WITH RADIOACTIVE SEED AND SENTINEL LYMPH NODE BIOPSY Right 04/30/2017   Procedure: BREAST LUMPECTOMY WITH RADIOACTIVE SEED AND AXILLARY SENTINEL LYMPH NODE BIOPSY;  Surgeon: Erroll Luna, MD;  Location: [facility];  Service: General;  Laterality: Right;  . DILATATION & CURETTAGE/HYSTEROSCOPY WITH MYOSURE N/A 09/21/2017   Procedure: [place];  Surgeon: Servando Salina, MD;  Location: [facility];  Service: Gynecology;  Laterality: N/A;  . THYROID SURGERY  11/02/2005   thyroid exploration with isthmusectomy    There were no vitals filed for this visit.  Subjective Assessment - 10/17/17 1354    Subjective  I'm on antibiotics for a bladder infection but I don't have them with me, have those for 6 more days. But my Rt shoulder seems to be  improving, I can reach higher than before. Stil can feel a "twinge" with my end ROM.    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 It measures 1.4 cm and is located in the upper outer quadrant. It is ER/PR positive and HER2 negative with a Ki67 of 15%. Had a right lumpectomy on 04/30/17 and completed radiation on 07/18/17. Will undergo a hysteroscopy on 09/21/17 to biopsy some polyps    Patient Stated Goals  to get full ROM of RUE and decrease pain    Currently in Pain?  Yes    Pain Score  3     Pain Location  Flank    Pain Orientation  Right    Pain Descriptors / Indicators  Aching    Pain Type  Surgical pain    Pain Onset  More than a month ago    Pain Frequency  Intermittent    Aggravating Factors   being cold and I can feel it when I yawn    Pain Relieving Factors  stretches have helped                      [facility] Adult PT Treatment/Exercise - 10/17/17 0001      Shoulder Exercises: Standing   Other Standing Exercises  Red theraband around forearms on wall (elbows at  shoulder height) for scapular retraction with alternating Rt, then Lt always supporting self on wall in standing plank position and slight thoracic rotation at end of each motion, 10 times each      Shoulder Exercises: Pulleys   Flexion  2 minutes    ABduction  2 minutes      Shoulder Exercises: Therapy Ball   Flexion  10 reps With forward lean into end of stretch    ABduction  10 reps Rt UE with side lean into end of stretch    ABduction Limitations  Pt was having audible popping with this today that pt felt was the cord      Shoulder Exercises: Stretch   Corner Stretch  3 reps;20 seconds In doorway      Manual Therapy   Manual Therapy  Soft tissue mobilization;Myofascial release;Passive ROM    Soft tissue mobilization  with biotone to tight pec major and tight muscles in axilla     Myofascial Release  to pec major/anterior  axilla/ cording in axilla with extra needed for release     Passive ROM  to right shoulder in direction of flexion, abduction, and D2 with stretching to axilla                    Breast Clinic Goals - 04/04/17 1313      Patient will be able to verbalize understanding of pertinent lymphedema risk reduction practices relevant to her diagnosis specifically related to skin care.   Time  1    Period  Days    Status  Achieved      Patient will be able to return demonstrate and/or verbalize understanding of the post-op home exercise program related to regaining shoulder range of motion.   Time  1    Period  Days    Status  Achieved      Patient will be able to verbalize understanding of the importance of attending the postoperative After Breast Cancer Class for further lymphedema risk reduction education and therapeutic exercise.   Time  1    Period  Days    Status  Achieved       Long Term Clinic Goals - 10/15/17 1439      CC Long Term Goal  #1   Title  Pt will report a 65% improvement in right lateral trunk tightness to allow for decreased discomfort    Baseline  09/28/17- 40% improved    Time  4    Period  Weeks    Status  On-going      CC Long Term Goal  #2   Title  Pt will report a 75% improvement in pain in right breast to allow improved function    Baseline  09/28/17- 30% improvement    Time  4    Period  Weeks    Status  On-going      CC Long Term Goal  #3   Title  Pt to demonstrate 160 degrees of bilateral shoulder flexion to allow pt to reach overhead.    Baseline  R 143, L 154, 09/28/17- R 148, L 160, 10/15/17/ R150. L 160     Time  4    Period  Weeks    Status  On-going      CC Long Term Goal  #4   Title  pt to demosntrate 170 degrees of right shoulder abduction to allow pt to reach out to side    Baseline  143, 09/28/17- R  170 degrees    Status  Achieved      CC Long Term Goal  #5   Title  Pt to be independent in a home exercise program for  continued strengthening and stretching.     Time  4    Period  Weeks    Status  On-going         Plan - 10/17/17 1408    Clinical Impression Statement  Pt continues to tolerate AA/ROM and stretching in gym well reporting feeling good end ROM stretch, also tolerated new progression of exercise with red theraband well but reported this challenging. Cont to focus on Rt axillary cording/tightness and end ROM with manual therapy.     Rehab Potential  Good    Clinical Impairments Affecting Rehab Potential  hx of radiation, pt undergoing hysteroscopy on 09/21/17    PT Frequency  2x / week    PT Duration  4 weeks    PT Treatment/Interventions  ADLs/Self Care Home Management;Therapeutic exercise;Therapeutic activities;Patient/family education;Orthotic Fit/Training;Manual lymph drainage;Manual techniques;Scar mobilization;Passive range of motion;Taping    PT Next Visit Plan  Review MLD prn; Progress gentle AA/A/PROM to right shoulder pulleys, ball. with progression of home exercise program.  Focus on manual techniques to cording     Consulted and Agree with Plan of Care  Patient       Patient will benefit from skilled therapeutic intervention in order to improve the following deficits and impairments:  Increased fascial restricitons, Pain, Postural dysfunction, Decreased scar mobility, Decreased strength, Decreased range of motion, Increased edema  Visit Diagnosis: Stiffness of right shoulder, not elsewhere classified  Acute pain of right shoulder  Abnormal posture  Muscle weakness (generalized)  Localized edema  Disorder of the skin and subcutaneous tissue related to radiation, unspecified     Problem List Patient Active Problem List   Diagnosis Date Noted  . Malignant neoplasm of upper-outer quadrant of right breast in female, estrogen receptor positive ([place]) 04/03/2017  . Thrombosed external hemorrhoid 12/04/2011  . OQHUTMLY(650.3Otelia Limes, PTA 10/17/2017,  2:34 PM  [place], [place], [address] Phone: [phone]   Fax:  [phone]  Name: Brandi Lewis MRN: [id number] Date of Birth: 1965/12/23

## 2017-10-17 NOTE — Progress Notes (Signed)
CLINIC:  Survivorship   REASON FOR VISIT:  Routine follow-up post-treatment for a recent history of breast cancer.  BRIEF ONCOLOGIC HISTORY:    Malignant neoplasm of upper-outer quadrant of right breast in female, estrogen receptor positive ([place])   03/26/2017 Initial Diagnosis    Screening detected right breast breast density 1.4 cm, axilla negative, biopsy: Grade 2 IDC with DCIS ER 90%, PR 90%, HER-2 negative ratio 1.05, Ki-67 15%, T1c N0 stage IA clinical stage      04/30/2017 Surgery    Right lumpectomy: IDC grade 1, 1.4 cm, DCIS grade 1, margins negative, N0 stage IA, ER 90%, PR 90%, HER-2 negative, Ki-67 15%      05/04/2017 Oncotype testing    Oncotype score 16, low risk, 10% ROR      05/31/2017 - 07/13/2017 Radiation Therapy    Adjuvant radiation therapy      07/2017 -  Anti-estrogen oral therapy    Tamoxifen daily       INTERVAL HISTORY:  Ms. Gilmore presents to the [facility] today for our initial meeting to review her survivorship care plan detailing her treatment course for breast cancer, as well as monitoring long-term side effects of that treatment, education regarding health maintenance, screening, and overall wellness and health promotion.     Overall, Ms. Hotard reports feeling quite well.  She is tamoxifen daily.  She has some occasional nausea, but otherwise seems to be tolerating it well.     REVIEW OF SYSTEMS:  Review of Systems  Constitutional: Negative for appetite change, chills, fatigue, fever and unexpected weight change.  HENT:   Negative for hearing loss and lump/mass.   Eyes: Negative for eye problems and icterus.  Respiratory: Negative for chest tightness, cough and shortness of breath.   Cardiovascular: Negative for chest pain and leg swelling.  Gastrointestinal: Positive for abdominal pain (since D & C, has undergone thorough work up by GI including multiple scans and tests). Negative for abdominal distention, constipation,  diarrhea, nausea and vomiting.  Endocrine: Negative for hot flashes.  Musculoskeletal: Negative for arthralgias.  Skin: Negative for itching and rash.  Neurological: Negative for dizziness, extremity weakness, headaches and numbness.  Hematological: Negative for adenopathy. Does not bruise/bleed easily.  Psychiatric/Behavioral: Negative for depression. The patient is not nervous/anxious.   Breast: Denies any new nodularity, masses, tenderness, nipple changes, or nipple discharge.      ONCOLOGY TREATMENT TEAM:  1. Surgeon:  Dr. Brantley Stage at [facility] 2. Medical Oncologist: Dr. Lindi Adie  3. Radiation Oncologist: Dr. Lisbeth Renshaw    PAST MEDICAL/SURGICAL HISTORY:  Past Medical History:  Diagnosis Date  . Allergy    codeine  . Breast cancer ([place]) 03/26/2017 bx   right breast   . H/O partial thyroidectomy 2006   nodule  . Headache(784.0)    migraine  . Thrombosed hemorrhoids    Past Surgical History:  Procedure Laterality Date  . BREAST BIOPSY  12/08/2005  . BREAST CYST EXCISION  2007   rt breast  . BREAST EXCISIONAL BIOPSY Right 03/30/2010  . BREAST EXCISIONAL BIOPSY Right 1984  . BREAST LUMPECTOMY WITH RADIOACTIVE SEED AND SENTINEL LYMPH NODE BIOPSY Right 04/30/2017   Procedure: BREAST LUMPECTOMY WITH RADIOACTIVE SEED AND AXILLARY SENTINEL LYMPH NODE BIOPSY;  Surgeon: Erroll Luna, MD;  Location: [facility];  Service: General;  Laterality: Right;  . DILATATION & CURETTAGE/HYSTEROSCOPY WITH MYOSURE N/A 09/21/2017   Procedure: [place];  Surgeon: Servando Salina, MD;  Location: [facility];  Service: Gynecology;  Laterality: N/A;  . THYROID SURGERY  11/02/2005   thyroid exploration with isthmusectomy     ALLERGIES:  Allergies  Allergen Reactions  . Codeine Nausea And Vomiting     CURRENT MEDICATIONS:  Outpatient Encounter Medications as of 10/18/2017  Medication Sig  . docusate sodium (COLACE) 100 MG capsule  Take 100 mg daily by mouth.  Marland Kitchen ibuprofen (ADVIL,MOTRIN) 800 MG tablet Take 1 tablet (800 mg total) by mouth every 8 (eight) hours as needed. (Patient taking differently: Take 800 mg every 8 (eight) hours as needed by mouth for headache or moderate pain. )  . promethazine (PHENERGAN) 25 MG tablet Take 25 mg every 6 (six) hours as needed by mouth for nausea or vomiting.  . tamoxifen (NOLVADEX) 20 MG tablet Take 1 tablet (20 mg total) by mouth daily.  Marland Kitchen tiZANidine (ZANAFLEX) 4 MG capsule Take 4 mg daily as needed by mouth for muscle spasms.   . Topiramate ER (QUDEXY XR) 200 MG CS24 sprinkle capsule Take 200 mg daily by mouth.   No facility-administered encounter medications on file as of 10/18/2017.      ONCOLOGIC FAMILY HISTORY:  Family History  Problem Relation Age of Onset  . Hypertension Mother   . Bone cancer Father   . Prostate cancer Father   . Colon cancer Maternal Grandfather      GENETIC COUNSELING/TESTING: Not at this time  SOCIAL HISTORY:  CHLOE FLIS is married and lives with her husband in [place], [place].  She has one child who lives in [place] as well.  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  She denies any current or history of tobacco, alcohol, or illicit drug use.     PHYSICAL EXAMINATION:  Vital Signs:   Vitals:   10/18/17 0839  BP: 107/70  Pulse: 91  Resp: 18  Temp: 97.8 F (36.6 C)  SpO2: 100%    General: Well-nourished, well-appearing female in no acute distress.  She is unaccompanied* today.   HEENT: Head is normocephalic.  Pupils equal and reactive to light. Conjunctivae clear without exudate.  Sclerae anicteric. Oral mucosa is pink, moist.  Oropharynx is pink without lesions or erythema.  Lymph: No cervical, supraclavicular, or infraclavicular lymphadenopathy noted on palpation.  Cardiovascular: Regular rate and rhythm.Marland Kitchen Respiratory: Clear to auscultation bilaterally. Chest  expansion symmetric; breathing non-labored.  Breast: right breast s/p lumpectomy with mild amt of scar tissue present, no nodules, no masses, left breast without nodules, masses skin or nipple changes, benign bilateral breast exam. GI: Abdomen soft and round; non-tender, non-distended. Bowel sounds normoactive.  GU: Deferred.  Neuro: No focal deficits. Steady gait.  Psych: Mood and affect normal and appropriate for situation.  Extremities: No edema. MSK: No focal spinal tenderness to palpation.  Full range of motion in bilateral upper extremities Skin: Warm and dry.  LABORATORY DATA:  None for this visit.  DIAGNOSTIC IMAGING:  None for this visit.      ASSESSMENT AND PLAN:  Ms.. Patton is a pleasant 51 y.o. female with Stage IA right breast invasive ductal carcinoma, ER+/PR+/HER2-, diagnosed in 03/2017, treated with lumpectomy, adjuvant radiation therapy, and anti-estrogen therapy with Tamoxifen beginning in 07/2017.  She presents to the Survivorship Clinic for our initial meeting and routine follow-up post-completion of treatment for breast cancer.    1. Stage IA right breast cancer:  Ms. Gafford is continuing to recover from definitive treatment for breast cancer. She will follow-up with her  medical oncologist, Dr. Lindi Adie in 6 months with history and physical exam per surveillance protocol.  She will continue her anti-estrogen therapy with Tamoxifen. Thus far, she is tolerating the Tamoxifen well, with minimal side effects. She was instructed to make Dr. Lindi Adie or myself aware if she begins to experience any worsening side effects of the medication and I could see her back in clinic to help manage those side effects, as needed. Today, a comprehensive survivorship care plan and treatment summary was reviewed with the patient today detailing her breast cancer diagnosis, treatment course, potential late/long-term effects of treatment, appropriate follow-up care with recommendations for the  future, and patient education resources.  A copy of this summary, along with a letter will be sent to the patient's primary care provider via mail/fax/In Basket message after today's visit.    2. Bone health:  Given Ms. Wiemers's age/history of breast cancer, she is at risk for bone demineralization. She was given education on specific activities to promote bone health.  3. Cancer screening:  Due to Ms. Tremper's history and her age, she should receive screening for skin cancers, colon cancer, and gynecologic cancers.  The information and recommendations are listed on the patient's comprehensive care plan/treatment summary and were reviewed in detail with the patient.    4. Health maintenance and wellness promotion: Ms. Nevers was encouraged to consume 5-7 servings of fruits and vegetables per day. We reviewed the "Nutrition Rainbow" handout, as well as the handout "Take Control of Your Health and Reduce Your Cancer Risk" from the [facility].  She was also encouraged to engage in moderate to vigorous exercise for 30 minutes per day most days of the week. We discussed the [employer] fitness program, which is designed for cancer survivors to help them become more physically fit after cancer treatments.  She was instructed to limit her alcohol consumption and continue to abstain from tobacco use.     5. Support services/counseling: It is not uncommon for this period of the patient's cancer care trajectory to be one of many emotions and stressors.  We discussed an opportunity for her to participate in the next session of [facility] ("Finding Your New Normal") support group series designed for patients after they have completed treatment.   Ms. Radich was encouraged to take advantage of our many other support services programs, support groups, and/or counseling in coping with her new life as a cancer survivor after completing anti-cancer treatment.  She was offered support today through  active listening and expressive supportive counseling.  She was given information regarding our available services and encouraged to contact me with any questions or for help enrolling in any of our support group/programs.    Dispo:   -Return to cancer center in 6 months for follow up with Dr. Lindi Adie  -Mammogram due in 03/2018 -Follow up with Dr. Brantley Stage 10/2018 -She is welcome to return back to the Survivorship Clinic at any time; no additional follow-up needed at this time.  -Consider referral back to survivorship as a long-term survivor for continued surveillance  A total of (30) minutes of face-to-face time was spent with this patient with greater than 50% of that time in counseling and care-coordination.   Gardenia Phlegm, NP Survivorship Program [place] [phone]   Note: PRIMARY CARE PROVIDER Patient, No Pcp Per None None

## 2017-10-18 ENCOUNTER — Telehealth: Payer: Self-pay | Admitting: Adult Health

## 2017-10-18 ENCOUNTER — Encounter: Payer: Self-pay | Admitting: Adult Health

## 2017-10-18 ENCOUNTER — Ambulatory Visit (HOSPITAL_BASED_OUTPATIENT_CLINIC_OR_DEPARTMENT_OTHER): Payer: BLUE CROSS/BLUE SHIELD | Admitting: Adult Health

## 2017-10-18 VITALS — BP 107/70 | HR 91 | Temp 97.8°F | Resp 18

## 2017-10-18 DIAGNOSIS — Z170 Estrogen receptor positive status [ER+]: Secondary | ICD-10-CM

## 2017-10-18 DIAGNOSIS — C50411 Malignant neoplasm of upper-outer quadrant of right female breast: Secondary | ICD-10-CM

## 2017-10-18 DIAGNOSIS — Z79810 Long term (current) use of selective estrogen receptor modulators (SERMs): Secondary | ICD-10-CM | POA: Diagnosis not present

## 2017-10-18 NOTE — Telephone Encounter (Signed)
Gave patient AVS and calendar of upcoming June appointments.  °

## 2017-10-24 ENCOUNTER — Ambulatory Visit: Payer: BLUE CROSS/BLUE SHIELD

## 2017-10-24 DIAGNOSIS — M25611 Stiffness of right shoulder, not elsewhere classified: Secondary | ICD-10-CM | POA: Diagnosis not present

## 2017-10-24 DIAGNOSIS — R293 Abnormal posture: Secondary | ICD-10-CM

## 2017-10-24 DIAGNOSIS — M25511 Pain in right shoulder: Secondary | ICD-10-CM

## 2017-10-24 NOTE — Therapy (Signed)
[place], [place], [address] Phone: [phone]   Fax:  [phone]  Physical Therapy Treatment  Patient Details  Name: Brandi Lewis MRN: [id number] Date of Birth: 02-09-1966 Referring Provider: Dr. Erroll Luna   Encounter Date: 10/24/2017  PT End of Session - 10/24/17 0933    Visit Number  9    Number of Visits  17    Date for PT Re-Evaluation  11/15/17    PT Start Time  0847    PT Stop Time  0933    PT Time Calculation (min)  46 min    Activity Tolerance  Patient tolerated treatment well    Behavior During Therapy  [facility] for tasks assessed/performed       Past Medical History:  Diagnosis Date  . Allergy    codeine  . Breast cancer ([place]) 03/26/2017 bx   right breast   . H/O partial thyroidectomy 2006   nodule  . Headache(784.0)    migraine  . Thrombosed hemorrhoids     Past Surgical History:  Procedure Laterality Date  . BREAST BIOPSY  12/08/2005  . BREAST CYST EXCISION  2007   rt breast  . BREAST EXCISIONAL BIOPSY Right 03/30/2010  . BREAST EXCISIONAL BIOPSY Right 1984  . BREAST LUMPECTOMY WITH RADIOACTIVE SEED AND SENTINEL LYMPH NODE BIOPSY Right 04/30/2017   Procedure: BREAST LUMPECTOMY WITH RADIOACTIVE SEED AND AXILLARY SENTINEL LYMPH NODE BIOPSY;  Surgeon: Erroll Luna, MD;  Location: [facility];  Service: General;  Laterality: Right;  . DILATATION & CURETTAGE/HYSTEROSCOPY WITH MYOSURE N/A 09/21/2017   Procedure: [place];  Surgeon: Servando Salina, MD;  Location: [facility];  Service: Gynecology;  Laterality: N/A;  . THYROID SURGERY  11/02/2005   thyroid exploration with isthmusectomy    There were no vitals filed for this visit.  Subjective Assessment - 10/24/17 0852    Subjective  I haven't felt the "twinge" in  a few days with end ROM reaching. Just still can really feel the cord, it's so annoying sometimes  that it feels like my bra rubbing me wrong or something. And I'm cramping again bad today at my lower abdominal area, I wish they could figure that out.     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 It measures 1.4 cm and is located in the upper outer quadrant. It is ER/PR positive and HER2 negative with a Ki67 of 15%. Had a right lumpectomy on 04/30/17 and completed radiation on 07/18/17. Will undergo a hysteroscopy on 09/21/17 to biopsy some polyps    Patient Stated Goals  to get full ROM of RUE and decrease pain    Currently in Pain?  Yes    Pain Score  8     Pain Location  Abdomen    Pain Orientation  Lower    Pain Descriptors / Indicators  Cramping;Aching;Sharp    Pain Type  Chronic pain    Pain Onset  More than a month ago    Pain Frequency  Intermittent    Aggravating Factors   not sure    Pain Relieving Factors  nothing really         [facility] PT Assessment - 10/24/17 0001      AROM   Right Shoulder Flexion  156 Degrees    Right Shoulder ABduction  172 Degrees    Right Shoulder Internal Rotation  75 Degrees  [facility] PT Treatment/Exercise - 10/24/17 0001      Manual Therapy   Manual Therapy  Soft tissue mobilization;Myofascial release;Passive ROM    Soft tissue mobilization  with biotone to tight pec major and tight muscles in axilla     Myofascial Release  to pec major/anterior axilla/ cording in axilla with extra needed for release     Passive ROM  to right shoulder in direction of flexion, abduction, and D2 with stretching to axilla                    Breast Clinic Goals - 04/04/17 1313      Patient will be able to verbalize understanding of pertinent lymphedema risk reduction practices relevant to her diagnosis specifically related to skin care.   Time  1    Period  Days    Status  Achieved      Patient will be able to return demonstrate and/or  verbalize understanding of the post-op home exercise program related to regaining shoulder range of motion.   Time  1    Period  Days    Status  Achieved      Patient will be able to verbalize understanding of the importance of attending the postoperative After Breast Cancer Class for further lymphedema risk reduction education and therapeutic exercise.   Time  1    Period  Days    Status  Achieved       Long Term Clinic Goals - 10/15/17 1439      CC Long Term Goal  #1   Title  Pt will report a 65% improvement in right lateral trunk tightness to allow for decreased discomfort    Baseline  09/28/17- 40% improved    Time  4    Period  Weeks    Status  On-going      CC Long Term Goal  #2   Title  Pt will report a 75% improvement in pain in right breast to allow improved function    Baseline  09/28/17- 30% improvement    Time  4    Period  Weeks    Status  On-going      CC Long Term Goal  #3   Title  Pt to demonstrate 160 degrees of bilateral shoulder flexion to allow pt to reach overhead.    Baseline  R 143, L 154, 09/28/17- R 148, L 160, 10/15/17/ R150. L 160     Time  4    Period  Weeks    Status  On-going      CC Long Term Goal  #4   Title  pt to demosntrate 170 degrees of right shoulder abduction to allow pt to reach out to side    Baseline  143, 09/28/17- R 170 degrees    Status  Achieved      CC Long Term Goal  #5   Title  Pt to be independent in a home exercise program for continued strengthening and stretching.     Time  4    Period  Weeks    Status  On-going         Plan - 10/24/17 6945    Clinical Impression Statement  Pts A/ROM has impoved some since last visit with flexion and IR. She reports "twinge" she was feeling with end ROM hasn't bothered her last few days. She does still have soft tissue limitations from cording at Rt axilla though this softened remarkably well by end  of session.  Pt is progressing well overall.     Rehab Potential  Good     Clinical Impairments Affecting Rehab Potential  hx of radiation, pt undergoing hysteroscopy on 09/21/17    PT Frequency  2x / week    PT Duration  4 weeks    PT Treatment/Interventions  ADLs/Self Care Home Management;Therapeutic exercise;Therapeutic activities;Patient/family education;Orthotic Fit/Training;Manual lymph drainage;Manual techniques;Scar mobilization;Passive range of motion;Taping    PT Next Visit Plan  Review MLD prn; Progress gentle AA/A/PROM to right shoulder pulleys, ball. with progression of home exercise program.  Focus on manual techniques to cording     Consulted and Agree with Plan of Care  Patient       Patient will benefit from skilled therapeutic intervention in order to improve the following deficits and impairments:  Increased fascial restricitons, Pain, Postural dysfunction, Decreased scar mobility, Decreased strength, Decreased range of motion, Increased edema  Visit Diagnosis: Stiffness of right shoulder, not elsewhere classified  Abnormal posture  Acute pain of right shoulder     Problem List Patient Active Problem List   Diagnosis Date Noted  . Malignant neoplasm of upper-outer quadrant of right breast in female, estrogen receptor positive ([place]) 04/03/2017  . Thrombosed external hemorrhoid 12/04/2011  . HFSFSELT(532.0Otelia Limes, PTA 10/24/2017, 9:36 AM  [place], [place], [address] Phone: [phone]   Fax:  [phone]  Name: Brandi Lewis MRN: [id number] Date of Birth: [birth year]

## 2017-10-30 ENCOUNTER — Ambulatory Visit: Payer: BLUE CROSS/BLUE SHIELD | Admitting: Physical Therapy

## 2017-10-30 DIAGNOSIS — M25511 Pain in right shoulder: Secondary | ICD-10-CM

## 2017-10-30 DIAGNOSIS — M25611 Stiffness of right shoulder, not elsewhere classified: Secondary | ICD-10-CM | POA: Diagnosis not present

## 2017-10-30 DIAGNOSIS — R293 Abnormal posture: Secondary | ICD-10-CM

## 2017-10-30 NOTE — Therapy (Signed)
[place], [place], [address] Phone: [phone]   Fax:  [phone]  Physical Therapy Treatment  Patient Details  Name: Brandi Lewis MRN: [id number] Date of Birth: 11/30/1965 Referring Provider: Dr. Erroll Luna   Encounter Date: 10/30/2017  PT End of Session - 10/30/17 1822    Visit Number  10    Number of Visits  17    Date for PT Re-Evaluation  11/15/17    PT Start Time  1304    PT Stop Time  1348    PT Time Calculation (min)  44 min    Activity Tolerance  Patient tolerated treatment well;Patient limited by pain    Behavior During Therapy  [facility] for tasks assessed/performed       Past Medical History:  Diagnosis Date  . Allergy    codeine  . Breast cancer ([place]) 03/26/2017 bx   right breast   . H/O partial thyroidectomy 2006   nodule  . Headache(784.0)    migraine  . Thrombosed hemorrhoids     Past Surgical History:  Procedure Laterality Date  . BREAST BIOPSY  12/08/2005  . BREAST CYST EXCISION  2007   rt breast  . BREAST EXCISIONAL BIOPSY Right 03/30/2010  . BREAST EXCISIONAL BIOPSY Right 1984  . BREAST LUMPECTOMY WITH RADIOACTIVE SEED AND SENTINEL LYMPH NODE BIOPSY Right 04/30/2017   Procedure: BREAST LUMPECTOMY WITH RADIOACTIVE SEED AND AXILLARY SENTINEL LYMPH NODE BIOPSY;  Surgeon: Erroll Luna, MD;  Location: [facility];  Service: General;  Laterality: Right;  . DILATATION & CURETTAGE/HYSTEROSCOPY WITH MYOSURE N/A 09/21/2017   Procedure: [place];  Surgeon: Servando Salina, MD;  Location: [facility];  Service: Gynecology;  Laterality: N/A;  . THYROID SURGERY  11/02/2005   thyroid exploration with isthmusectomy    There were no vitals filed for this visit.  Subjective Assessment - 10/30/17 1305    Subjective  Nothing new today. Still doing better with the twinges.    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 It measures 1.4 cm and is located in the upper outer quadrant. It is ER/PR positive and HER2 negative with a Ki67 of 15%. Had a right lumpectomy on 04/30/17 and completed radiation on 07/18/17. Will undergo a hysteroscopy on 09/21/17 to biopsy some polyps    Currently in Pain?  No/denies                      [facility] Adult PT Treatment/Exercise - 10/30/17 0001      Manual Therapy   Manual Therapy  Other (comment)    Soft tissue mobilization  worked on right anterior upper axilla cording, but this was uncomfortable for patient, so limited.    Myofascial Release  to pect on right with unidirectional release, then crosshands in diagonals from right anterior shoulder to abdomen at a couple of different angles; right UE myofascial pulling     Passive ROM  to right shoulder: er, abduction and flexion to pt. tolerance; also into horizontal abduction and D2 positions as tolerated; right outer pect two-way stretch in supine    Other Manual Therapy  soft tissue work with Biotone to right outer pect pect is extremely tight                   Breast Clinic Goals - 04/04/17 1313      Patient will  be able to verbalize understanding of pertinent lymphedema risk reduction practices relevant to her diagnosis specifically related to skin care.   Time  1    Period  Days    Status  Achieved      Patient will be able to return demonstrate and/or verbalize understanding of the post-op home exercise program related to regaining shoulder range of motion.   Time  1    Period  Days    Status  Achieved      Patient will be able to verbalize understanding of the importance of attending the postoperative After Breast Cancer Class for further lymphedema risk reduction education and therapeutic exercise.   Time  1    Period  Days    Status  Achieved       Long Term Clinic Goals - 10/30/17 1306      CC Long Term Goal   #1   Title  Pt will report a 65% improvement in right lateral trunk tightness to allow for decreased discomfort    Baseline  09/28/17- 40% improved; on 10/30/17 80% better    Status  Achieved      CC Long Term Goal  #2   Title  Pt will report a 75% improvement in pain in right breast to allow improved function    Baseline  09/28/17- 30% improvement; on 10/30/17 70% better    Status  Partially Met      CC Long Term Goal  #3   Title  Pt to demonstrate 160 degrees of bilateral shoulder flexion to allow pt to reach overhead.    Status  On-going      CC Long Term Goal  #4   Title  pt to demosntrate 170 degrees of right shoulder abduction to allow pt to reach out to side    Status  Achieved      CC Long Term Goal  #5   Title  Pt to be independent in a home exercise program for continued strengthening and stretching.     Status  On-going         Plan - 10/30/17 1822    Clinical Impression Statement  Pt. has significant tightness in her right pectoral muscles and her ROM is limited by pain as well.  She is improving, but will need more time for optimal function. She did report feeling good, looser at end of session.    Rehab Potential  Good    Clinical Impairments Affecting Rehab Potential  hx of radiation, pt undergoing hysteroscopy on 09/21/17    PT Frequency  2x / week    PT Duration  4 weeks    PT Treatment/Interventions  ADLs/Self Care Home Management;Therapeutic exercise;Therapeutic activities;Patient/family education;Orthotic Fit/Training;Manual lymph drainage;Manual techniques;Scar mobilization;Passive range of motion;Taping    PT Next Visit Plan  try supine over towel roll or foam roller for pect stretches, or other pect stretches such as hooklying with arms outstretched lower trunk rotation; continue manual stretching and soft tissue work, myofascial work to right shoulder/pect area    PT Home Exercise Plan  post op breast HEP, supine dowel ex, supine scap series    Consulted and  Agree with Plan of Care  Patient       Patient will benefit from skilled therapeutic intervention in order to improve the following deficits and impairments:  Increased fascial restricitons, Pain, Postural dysfunction, Decreased scar mobility, Decreased strength, Decreased range of motion, Increased edema  Visit Diagnosis: Stiffness of right shoulder, not elsewhere classified  Abnormal posture  Acute pain of right shoulder     Problem List Patient Active Problem List   Diagnosis Date Noted  . Malignant neoplasm of upper-outer quadrant of right breast in female, estrogen receptor positive ([place]) 04/03/2017  . Thrombosed external hemorrhoid 12/04/2011  . JIRCVELF(810.1Serafina Royals 10/30/2017, 6:24 PM  [place], [place], [address] Phone: [phone]   Fax:  [phone]  Name: Brandi Lewis MRN: [id number] Date of Birth: 1966-02-26  Serafina Royals, PT 10/30/17 6:24 PM

## 2017-11-01 ENCOUNTER — Ambulatory Visit: Payer: BLUE CROSS/BLUE SHIELD

## 2017-11-08 ENCOUNTER — Ambulatory Visit: Payer: BLUE CROSS/BLUE SHIELD | Admitting: Physical Therapy

## 2017-11-08 ENCOUNTER — Encounter: Payer: Self-pay | Admitting: Physical Therapy

## 2017-11-08 DIAGNOSIS — M25511 Pain in right shoulder: Secondary | ICD-10-CM

## 2017-11-08 DIAGNOSIS — R293 Abnormal posture: Secondary | ICD-10-CM

## 2017-11-08 DIAGNOSIS — R600 Localized edema: Secondary | ICD-10-CM

## 2017-11-08 DIAGNOSIS — L599 Disorder of the skin and subcutaneous tissue related to radiation, unspecified: Secondary | ICD-10-CM

## 2017-11-08 DIAGNOSIS — M25611 Stiffness of right shoulder, not elsewhere classified: Secondary | ICD-10-CM | POA: Diagnosis not present

## 2017-11-08 DIAGNOSIS — M6281 Muscle weakness (generalized): Secondary | ICD-10-CM

## 2017-11-08 NOTE — Therapy (Signed)
[place], [place], [address] Phone: [phone]   Fax:  [phone]  Physical Therapy Treatment  Patient Details  Name: Brandi Lewis MRN: [id number] Date of Birth: 05-25-1966 Referring Provider: Dr. Erroll Luna   Encounter Date: 11/08/2017  PT End of Session - 11/08/17 1705    Visit Number  11    Number of Visits  17    Date for PT Re-Evaluation  11/15/17    PT Start Time  1600    PT Stop Time  1645    PT Time Calculation (min)  45 min    Activity Tolerance  Patient tolerated treatment well    Behavior During Therapy  [facility] for tasks assessed/performed       Past Medical History:  Diagnosis Date  . Allergy    codeine  . Breast cancer ([place]) 03/26/2017 bx   right breast   . H/O partial thyroidectomy 2006   nodule  . Headache(784.0)    migraine  . Thrombosed hemorrhoids     Past Surgical History:  Procedure Laterality Date  . BREAST BIOPSY  12/08/2005  . BREAST CYST EXCISION  2007   rt breast  . BREAST EXCISIONAL BIOPSY Right 03/30/2010  . BREAST EXCISIONAL BIOPSY Right 1984  . BREAST LUMPECTOMY WITH RADIOACTIVE SEED AND SENTINEL LYMPH NODE BIOPSY Right 04/30/2017   Procedure: BREAST LUMPECTOMY WITH RADIOACTIVE SEED AND AXILLARY SENTINEL LYMPH NODE BIOPSY;  Surgeon: Erroll Luna, MD;  Location: [facility];  Service: General;  Laterality: Right;  . DILATATION & CURETTAGE/HYSTEROSCOPY WITH MYOSURE N/A 09/21/2017   Procedure: [place];  Surgeon: Servando Salina, MD;  Location: [facility];  Service: Gynecology;  Laterality: N/A;  . THYROID SURGERY  11/02/2005   thyroid exploration with isthmusectomy    There were no vitals filed for this visit.  Subjective Assessment - 11/08/17 1614    Subjective  "It's hurting today.  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 It measures 1.4 cm and is located in the upper outer quadrant. It is ER/PR positive and HER2 negative with a Ki67 of 15%. Had a right lumpectomy on 04/30/17 and completed radiation on 07/18/17. Will undergo a hysteroscopy on 09/21/17 to biopsy some polyps    Patient Stated Goals  to get full ROM of RUE and decrease pain    Currently in Pain?  Yes    Pain Score  6     Pain Location  Axilla    Pain Orientation  Left    Pain Radiating Towards  to upper arm     Aggravating Factors   not really     Pain Relieving Factors  not really                       [facility] Adult PT Treatment/Exercise - 11/08/17 0001      Manual Therapy   Soft tissue mobilization  with biotone to tight pec major and tight muscles in posterior axilla and along posterior lateral chest     Passive ROM  to right shoulder in direction of flexion and abduction                   Breast Clinic Goals - 04/04/17 1313      Patient will be able to verbalize understanding of pertinent lymphedema  risk reduction practices relevant to her diagnosis specifically related to skin care.   Time  1    Period  Days    Status  Achieved      Patient will be able to return demonstrate and/or verbalize understanding of the post-op home exercise program related to regaining shoulder range of motion.   Time  1    Period  Days    Status  Achieved      Patient will be able to verbalize understanding of the importance of attending the postoperative After Breast Cancer Class for further lymphedema risk reduction education and therapeutic exercise.   Time  1    Period  Days    Status  Achieved       Long Term Clinic Goals - 10/30/17 1306      CC Long Term Goal  #1   Title  Pt will report a 65% improvement in right lateral trunk tightness to allow for decreased discomfort    Baseline  09/28/17- 40% improved; on 10/30/17 80% better    Status  Achieved      CC Long  Term Goal  #2   Title  Pt will report a 75% improvement in pain in right breast to allow improved function    Baseline  09/28/17- 30% improvement; on 10/30/17 70% better    Status  Partially Met      CC Long Term Goal  #3   Title  Pt to demonstrate 160 degrees of bilateral shoulder flexion to allow pt to reach overhead.    Status  On-going      CC Long Term Goal  #4   Title  pt to demosntrate 170 degrees of right shoulder abduction to allow pt to reach out to side    Status  Achieved      CC Long Term Goal  #5   Title  Pt to be independent in a home exercise program for continued strengthening and stretching.     Status  On-going         Plan - 11/08/17 1705    Clinical Impression Statement  Pt had persistent tightness in posterior axilla ( rotator cuff muscle bellies)  and lattissumus that required extra time and soft tissue work to get them to relax.  She also continues to have fullness in right lateral chest, but it is much softer than previously.  Cording in axilla appears to be improved.     Rehab Potential  Good    Clinical Impairments Affecting Rehab Potential  hx of radiation, pt undergoing hysteroscopy on 09/21/17    PT Frequency  2x / week    PT Duration  4 weeks    PT Next Visit Plan  if soft tissue is improved, try supine over towel roll or foam roller for pect stretches, or other pect stretches such as hooklying with arms outstretched lower trunk rotation; continue manual stretching and soft tissue work, myofascial work to right shoulder/pect area Reassess goals for discharge        Patient will benefit from skilled therapeutic intervention in order to improve the following deficits and impairments:  Increased fascial restricitons, Pain, Postural dysfunction, Decreased scar mobility, Decreased strength, Decreased range of motion, Increased edema  Visit Diagnosis: Stiffness of right shoulder, not elsewhere classified  Abnormal posture  Acute pain of right  shoulder  Muscle weakness (generalized)  Localized edema  Disorder of the skin and subcutaneous tissue related to radiation, unspecified     Problem List  Patient Active Problem List   Diagnosis Date Noted  . Malignant neoplasm of upper-outer quadrant of right breast in female, estrogen receptor positive ([place]) 04/03/2017  . Thrombosed external hemorrhoid 12/04/2011  . [id number](008.6Donato Heinz. Owens Shark PT   Norwood Levo 11/08/2017, 5:08 PM  [place], [place], [address] Phone: [phone]   Fax:  [phone]  Name: ANTANISHA MOHS MRN: [id number] Date of Birth: 07/25/1966

## 2017-11-09 ENCOUNTER — Ambulatory Visit: Payer: BLUE CROSS/BLUE SHIELD | Admitting: Physical Therapy

## 2017-11-12 ENCOUNTER — Ambulatory Visit: Payer: BLUE CROSS/BLUE SHIELD

## 2017-11-12 DIAGNOSIS — M25511 Pain in right shoulder: Secondary | ICD-10-CM

## 2017-11-12 DIAGNOSIS — R600 Localized edema: Secondary | ICD-10-CM

## 2017-11-12 DIAGNOSIS — M25611 Stiffness of right shoulder, not elsewhere classified: Secondary | ICD-10-CM

## 2017-11-12 DIAGNOSIS — R293 Abnormal posture: Secondary | ICD-10-CM

## 2017-11-12 DIAGNOSIS — M6281 Muscle weakness (generalized): Secondary | ICD-10-CM

## 2017-11-12 DIAGNOSIS — L599 Disorder of the skin and subcutaneous tissue related to radiation, unspecified: Secondary | ICD-10-CM

## 2017-11-12 NOTE — Therapy (Signed)
[place], [place], [address] Phone: [phone]   Fax:  [phone]  Physical Therapy Treatment  Patient Details  Name: Brandi Lewis MRN: [id number] Date of Birth: 05-Sep-1966 Referring Provider: Dr. Erroll Luna   Encounter Date: 11/12/2017  PT End of Session - 11/12/17 1105    Visit Number  12    Number of Visits  17    Date for PT Re-Evaluation  11/15/17    PT Start Time  6256    PT Stop Time  1105    PT Time Calculation (min)  50 min    Activity Tolerance  Patient tolerated treatment well    Behavior During Therapy  [facility] for tasks assessed/performed       Past Medical History:  Diagnosis Date  . Allergy    codeine  . Breast cancer ([place]) 03/26/2017 bx   right breast   . H/O partial thyroidectomy 2006   nodule  . Headache(784.0)    migraine  . Thrombosed hemorrhoids     Past Surgical History:  Procedure Laterality Date  . BREAST BIOPSY  12/08/2005  . BREAST CYST EXCISION  2007   rt breast  . BREAST EXCISIONAL BIOPSY Right 03/30/2010  . BREAST EXCISIONAL BIOPSY Right 1984  . BREAST LUMPECTOMY WITH RADIOACTIVE SEED AND SENTINEL LYMPH NODE BIOPSY Right 04/30/2017   Procedure: BREAST LUMPECTOMY WITH RADIOACTIVE SEED AND AXILLARY SENTINEL LYMPH NODE BIOPSY;  Surgeon: Erroll Luna, MD;  Location: [facility];  Service: General;  Laterality: Right;  . DILATATION & CURETTAGE/HYSTEROSCOPY WITH MYOSURE N/A 09/21/2017   Procedure: [place];  Surgeon: Servando Salina, MD;  Location: [facility];  Service: Gynecology;  Laterality: N/A;  . THYROID SURGERY  11/02/2005   thyroid exploration with isthmusectomy    There were no vitals filed for this visit.  Subjective Assessment - 11/12/17 1017    Subjective  My pain is better since being here last. The massage really helped. Just feeling the same tightness at my Rt axilla now. My cording  seems to be improving, able to reach higher now and it's not "popping" out as much.     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 It measures 1.4 cm and is located in the upper outer quadrant. It is ER/PR positive and HER2 negative with a Ki67 of 15%. Had a right lumpectomy on 04/30/17 and completed radiation on 07/18/17. Will undergo a hysteroscopy on 09/21/17 to biopsy some polyps    Patient Stated Goals  to get full ROM of RUE and decrease pain    Currently in Pain?  No/denies         [facility] PT Assessment - 11/12/17 0001      AROM   Right Shoulder Flexion  160 Degrees    Right Shoulder ABduction  175 Degrees    Right Shoulder Internal Rotation  70 Degrees    Left Shoulder Flexion  160 Degrees                  [facility] Adult PT Treatment/Exercise - 11/12/17 0001      Shoulder Exercises: Supine   Horizontal ABduction  AROM;Both;10 reps Over towel roll along T spine; also with arms held for 2 min    Other Supine Exercises  Over towel roll along T spine in a "V", 10 times; VCs throughout for muscle relaxation in scapular muscles and upper  traps      Manual Therapy   Myofascial Release  to pect on right with unidirectional release, then crosshands in diagonals from right anterior shoulder to abdomen at a couple of different angles; right UE myofascial pulling     Passive ROM  to right shoulder in direction of flexion, abduction, and D2 to pts tolerance                   Breast Clinic Goals - 04/04/17 1313      Patient will be able to verbalize understanding of pertinent lymphedema risk reduction practices relevant to her diagnosis specifically related to skin care.   Time  1    Period  Days    Status  Achieved      Patient will be able to return demonstrate and/or verbalize understanding of the post-op home exercise program related to regaining shoulder range of motion.   Time  1     Period  Days    Status  Achieved      Patient will be able to verbalize understanding of the importance of attending the postoperative After Breast Cancer Class for further lymphedema risk reduction education and therapeutic exercise.   Time  1    Period  Days    Status  Achieved       Long Term Clinic Goals - 10/30/17 1306      CC Long Term Goal  #1   Title  Pt will report a 65% improvement in right lateral trunk tightness to allow for decreased discomfort    Baseline  09/28/17- 40% improved; on 10/30/17 80% better    Status  Achieved      CC Long Term Goal  #2   Title  Pt will report a 75% improvement in pain in right breast to allow improved function    Baseline  09/28/17- 30% improvement; on 10/30/17 70% better    Status  Partially Met      CC Long Term Goal  #3   Title  Pt to demonstrate 160 degrees of bilateral shoulder flexion to allow pt to reach overhead.    Status  On-going      CC Long Term Goal  #4   Title  pt to demosntrate 170 degrees of right shoulder abduction to allow pt to reach out to side    Status  Achieved      CC Long Term Goal  #5   Title  Pt to be independent in a home exercise program for continued strengthening and stretching.     Status  On-going         Plan - 11/12/17 1106    Clinical Impression Statement  Pt doing much better since last visit. Her pain much improved and able to tolerate near end ROM stretching today with P/ROM. She has met all goals at this time and has one more visit scheduled for Jan 11. Pt to focus on end ROM stretching at home over next week and a half and decide if ready to D/C at next visit.  Instructed pt to cont towel roll stretching at home.     Rehab Potential  Good    Clinical Impairments Affecting Rehab Potential  hx of radiation, pt undergoing hysteroscopy on 09/21/17    PT Frequency  2x / week    PT Duration  4 weeks    PT Treatment/Interventions  ADLs/Self Care Home Management;Therapeutic exercise;Therapeutic  activities;Patient/family education;Orthotic Fit/Training;Manual lymph drainage;Manual techniques;Scar mobilization;Passive range of motion;Taping  PT Next Visit Plan  Pt with one more visit. Potential D/C at next visit if pt doing well with HEP stretching.     Consulted and Agree with Plan of Care  Patient       Patient will benefit from skilled therapeutic intervention in order to improve the following deficits and impairments:  Increased fascial restricitons, Pain, Postural dysfunction, Decreased scar mobility, Decreased strength, Decreased range of motion, Increased edema  Visit Diagnosis: Stiffness of right shoulder, not elsewhere classified  Abnormal posture  Acute pain of right shoulder  Muscle weakness (generalized)  Localized edema  Disorder of the skin and subcutaneous tissue related to radiation, unspecified     Problem List Patient Active Problem List   Diagnosis Date Noted  . Malignant neoplasm of upper-outer quadrant of right breast in female, estrogen receptor positive ([place]) 04/03/2017  . Thrombosed external hemorrhoid 12/04/2011  . [id number](924.2Otelia Limes, PTA 11/12/2017, 11:09 AM  [place], [place], [address] Phone: [phone]   Fax:  [phone]  Name: Brandi Lewis MRN: [id number] Date of Birth: Mar 11, 1966

## 2017-11-23 ENCOUNTER — Ambulatory Visit: Payer: BLUE CROSS/BLUE SHIELD | Attending: Surgery | Admitting: Physical Therapy

## 2017-11-23 ENCOUNTER — Encounter: Payer: Self-pay | Admitting: Physical Therapy

## 2017-11-23 DIAGNOSIS — M25611 Stiffness of right shoulder, not elsewhere classified: Secondary | ICD-10-CM

## 2017-11-23 DIAGNOSIS — R293 Abnormal posture: Secondary | ICD-10-CM | POA: Diagnosis present

## 2017-11-23 DIAGNOSIS — M25511 Pain in right shoulder: Secondary | ICD-10-CM

## 2017-11-23 DIAGNOSIS — R600 Localized edema: Secondary | ICD-10-CM | POA: Diagnosis present

## 2017-11-23 DIAGNOSIS — L599 Disorder of the skin and subcutaneous tissue related to radiation, unspecified: Secondary | ICD-10-CM | POA: Diagnosis present

## 2017-11-23 DIAGNOSIS — M6281 Muscle weakness (generalized): Secondary | ICD-10-CM

## 2017-11-23 NOTE — Therapy (Signed)
[place], [place], [address] Phone: [phone]   Fax:  [phone]  Physical Therapy Treatment  Patient Details  Name: Brandi Lewis MRN: [id number] Date of Birth: Nov 18, 1965 Referring Provider: Kathlyn Sacramento Cornett   Encounter Date: 11/23/2017  PT End of Session - 11/23/17 1403    Visit Number  13    Number of Visits  25    Date for PT Re-Evaluation  01/21/18    PT Start Time  0815 pt arrived late     PT Stop Time  0845    PT Time Calculation (min)  30 min    Activity Tolerance  Patient tolerated treatment well    Behavior During Therapy  [facility] for tasks assessed/performed       Past Medical History:  Diagnosis Date  . Allergy    codeine  . Breast cancer ([place]) 03/26/2017 bx   right breast   . H/O partial thyroidectomy 2006   nodule  . Headache(784.0)    migraine  . Thrombosed hemorrhoids     Past Surgical History:  Procedure Laterality Date  . BREAST BIOPSY  12/08/2005  . BREAST CYST EXCISION  2007   rt breast  . BREAST EXCISIONAL BIOPSY Right 03/30/2010  . BREAST EXCISIONAL BIOPSY Right 1984  . BREAST LUMPECTOMY WITH RADIOACTIVE SEED AND SENTINEL LYMPH NODE BIOPSY Right 04/30/2017   Procedure: BREAST LUMPECTOMY WITH RADIOACTIVE SEED AND AXILLARY SENTINEL LYMPH NODE BIOPSY;  Surgeon: Erroll Luna, MD;  Location: [facility];  Service: General;  Laterality: Right;  . DILATATION & CURETTAGE/HYSTEROSCOPY WITH MYOSURE N/A 09/21/2017   Procedure: [place];  Surgeon: Servando Salina, MD;  Location: [facility];  Service: Gynecology;  Laterality: N/A;  . THYROID SURGERY  11/02/2005   thyroid exploration with isthmusectomy    There were no vitals filed for this visit.  Subjective Assessment - 11/23/17 0815    Subjective  "It comes and goes"     Pertinent History  Patient was diagnosed on 03/16/17 with right grade 2 invasive ductal  carcinoma breast cancer with DCIS. It measures 1.4 cm and is located in the upper outer quadrant. It is ER/PR positive and HER2 negative with a Ki67 of 15%. Had a right lumpectomy on 04/30/17 and completed radiation on 07/18/17. Will undergo a hysteroscopy on 09/21/17 to biopsy some polyps    Patient Stated Goals  to get full ROM of RUE and decrease pain    Currently in Pain?  Yes    Pain Score  2     Pain Location  Axilla    Pain Orientation  Right    Pain Descriptors / Indicators  Tightness pulling to upper arm     Pain Type  Chronic pain    Pain Radiating Towards  to upper arm     Pain Onset  More than a month ago    Pain Frequency  Intermittent    Aggravating Factors   pulling arm up in to external, tightens up after a while     Pain Relieving Factors  stretching exercises     Effect of Pain on Daily Activities  feels pulliing with opening a jar          [facility] PT Assessment - 11/23/17 0001      Assessment   Medical Diagnosis  Right breast cancer    Referring Provider  Dr.Thomas Cornett    Onset Date/Surgical Date  04/30/17      Prior  Function   Level of Independence  Independent      AROM   Right Shoulder Flexion  150 Degrees    Right Shoulder ABduction  175 Degrees      Palpation   Palpation comment  tightness in pec major with tight guitar string noted in certain positions of arm. Multipe tender trigger points along lateral scapula and axilla                   [facility] Adult PT Treatment/Exercise - 11/23/17 0001      Manual Therapy   Soft tissue mobilization  with biotone to tight pec major and tight muscles in posterior axilla and along posterior lateral chest     Myofascial Release  to pect on right with unidirectional release, then crosshands in diagonals from right anterior shoulder to abdomen at a couple of different angles; right UE myofascial pulling     Passive ROM  to right shoulder in direction of flexion, abduction, and D2 to pts tolerance                    Breast Clinic Goals - 04/04/17 1313      Patient will be able to verbalize understanding of pertinent lymphedema risk reduction practices relevant to her diagnosis specifically related to skin care.   Time  1    Period  Days    Status  Achieved      Patient will be able to return demonstrate and/or verbalize understanding of the post-op home exercise program related to regaining shoulder range of motion.   Time  1    Period  Days    Status  Achieved      Patient will be able to verbalize understanding of the importance of attending the postoperative After Breast Cancer Class for further lymphedema risk reduction education and therapeutic exercise.   Time  1    Period  Days    Status  Achieved       Long Term Clinic Goals - 11/23/17 [phone]      CC Long Term Goal  #1   Title  Pt will report a 65% improvement in right lateral trunk tightness to allow for decreased discomfort    Baseline  09/28/17- 40% improved; on 10/30/17 80% better    Status  Achieved      CC Long Term Goal  #2   Title  Pt will report a 75% improvement in pain in right breast to allow improved function    Baseline  09/28/17- 30% improvement; on 10/30/17 70% better    Status  Partially Met      CC Long Term Goal  #3   Title  Pt to demonstrate 160 degrees of bilateral shoulder flexion to allow pt to reach overhead.    Baseline  R 143, L 154, 09/28/17- R 148, L 160, 10/15/17/ R150. L 160     Status  On-going      CC Long Term Goal  #4   Title  pt to demosntrate 170 degrees of right shoulder abduction to allow pt to reach out to side    Baseline  , 11/23/2017 175 on RT     Status  Achieved      CC Long Term Goal  #5   Title  Pt to be independent in a home exercise program for continued strengthening and stretching.     Status  Achieved      CC Long Term Goal  #6  Title  Pt will report 50% less tenderness from trigger points in axilla and lateral chest and pulling in axilla from  cording     Time  8    Period  Weeks    Status  New      Additional Goals   Additional Goals  --         Plan - 11/23/17 1404    Clinical Impression Statement  Pt has made improvements but still has tight trigger points in lateral scapula and axilla with tightness and guitar string cording in right axilla.  She wants to continue 1-2x a week to see if she can continue to make progress.  She will be going out of town for at least a week so recert period was exteded to 8 weeks     Rehab Potential  Good    Clinical Impairments Affecting Rehab Potential  hx of radiation, pt undergoing hysteroscopy on 09/21/17    PT Frequency  2x / week    PT Duration  8 weeks    PT Treatment/Interventions  ADLs/Self Care Home Management;Therapeutic exercise;Therapeutic activities;Patient/family education;Orthotic Fit/Training;Manual lymph drainage;Manual techniques;Scar mobilization;Passive range of motion;Taping    PT Next Visit Plan  soft tissue work and myofascial release to lateral scapula and axilla with stretching and PROM        Patient will benefit from skilled therapeutic intervention in order to improve the following deficits and impairments:  Increased fascial restricitons, Pain, Postural dysfunction, Decreased scar mobility, Decreased strength, Decreased range of motion, Increased edema  Visit Diagnosis: Stiffness of right shoulder, not elsewhere classified - Plan: PT plan of care cert/re-cert  Abnormal posture - Plan: PT plan of care cert/re-cert  Acute pain of right shoulder - Plan: PT plan of care cert/re-cert  Muscle weakness (generalized) - Plan: PT plan of care cert/re-cert  Localized edema - Plan: PT plan of care cert/re-cert  Disorder of the skin and subcutaneous tissue related to radiation, unspecified - Plan: PT plan of care cert/re-cert     Problem List Patient Active Problem List   Diagnosis Date Noted  . Malignant neoplasm of upper-outer quadrant of right breast in  female, estrogen receptor positive ([place]) 04/03/2017  . Thrombosed external hemorrhoid 12/04/2011  . YOMAYOKH(997.7Donato Heinz. Owens Shark PT  Norwood Levo 11/23/2017, 2:09 PM  [place], [place], [address] Phone: [phone]   Fax:  [phone]  Name: MARINNA BLANE MRN: [id number] Date of Birth: 07-02-1966

## 2017-11-28 ENCOUNTER — Ambulatory Visit: Payer: BLUE CROSS/BLUE SHIELD | Admitting: Physical Therapy

## 2017-11-28 ENCOUNTER — Encounter: Payer: Self-pay | Admitting: Physical Therapy

## 2017-11-28 DIAGNOSIS — M6281 Muscle weakness (generalized): Secondary | ICD-10-CM

## 2017-11-28 DIAGNOSIS — M25611 Stiffness of right shoulder, not elsewhere classified: Secondary | ICD-10-CM

## 2017-11-28 DIAGNOSIS — L599 Disorder of the skin and subcutaneous tissue related to radiation, unspecified: Secondary | ICD-10-CM

## 2017-11-28 DIAGNOSIS — R600 Localized edema: Secondary | ICD-10-CM

## 2017-11-28 DIAGNOSIS — M25511 Pain in right shoulder: Secondary | ICD-10-CM

## 2017-11-28 DIAGNOSIS — R293 Abnormal posture: Secondary | ICD-10-CM

## 2017-11-28 NOTE — Therapy (Signed)
[place], [place], [address] Phone: [phone]   Fax:  [phone]  Physical Therapy Treatment  Patient Details  Name: Brandi Lewis MRN: [id number] Date of Birth: 1966/07/21 Referring Provider: Kathlyn Sacramento Cornett   Encounter Date: 11/28/2017  PT End of Session - 11/28/17 1156    Visit Number  15    Number of Visits  25    Date for PT Re-Evaluation  01/21/18    PT Start Time  1100 short treatment today     PT Stop Time  1145    PT Time Calculation (min)  45 min    Activity Tolerance  Patient tolerated treatment well    Behavior During Therapy  [facility] for tasks assessed/performed       Past Medical History:  Diagnosis Date  . Allergy    codeine  . Breast cancer ([place]) 03/26/2017 bx   right breast   . H/O partial thyroidectomy 2006   nodule  . Headache(784.0)    migraine  . Thrombosed hemorrhoids     Past Surgical History:  Procedure Laterality Date  . BREAST BIOPSY  12/08/2005  . BREAST CYST EXCISION  2007   rt breast  . BREAST EXCISIONAL BIOPSY Right 03/30/2010  . BREAST EXCISIONAL BIOPSY Right 1984  . BREAST LUMPECTOMY WITH RADIOACTIVE SEED AND SENTINEL LYMPH NODE BIOPSY Right 04/30/2017   Procedure: BREAST LUMPECTOMY WITH RADIOACTIVE SEED AND AXILLARY SENTINEL LYMPH NODE BIOPSY;  Surgeon: Erroll Luna, MD;  Location: [facility];  Service: General;  Laterality: Right;  . DILATATION & CURETTAGE/HYSTEROSCOPY WITH MYOSURE N/A 09/21/2017   Procedure: [place];  Surgeon: Servando Salina, MD;  Location: [facility];  Service: Gynecology;  Laterality: N/A;  . THYROID SURGERY  11/02/2005   thyroid exploration with isthmusectomy    There were no vitals filed for this visit.  Subjective Assessment - 11/28/17 1114    Subjective  Pt is still having pain in her axilla and across her chest. 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 It measures 1.4 cm and is located in the upper outer quadrant. It is ER/PR positive and HER2 negative with a Ki67 of 15%. Had a right lumpectomy on 04/30/17 and completed radiation on 07/18/17. Will undergo a hysteroscopy on 09/21/17 to biopsy some polyps    Patient Stated Goals  to get full ROM of RUE and decrease pain    Currently in Pain?  Yes    Pain Score  -- did not rate             LYMPHEDEMA/ONCOLOGY QUESTIONNAIRE - 11/28/17 1115      Right Upper Extremity Lymphedema   10 cm Proximal to Olecranon Process  25 cm    Olecranon Process  21.1 cm    15 cm Proximal to Ulnar Styloid Process  22 cm    10 cm Proximal to Ulnar Styloid Process  20 cm    Just Proximal to Ulnar Styloid Process  12.7 cm    Across Hand at [employer]  16.2 cm    At [place] of 2nd Digit  5.2 cm               [facility] Adult PT Treatment/Exercise - 11/28/17 0001      Manual Therapy  Manual Therapy  Edema management    Manual therapy comments  Pt brought in the sleeves she got last year as she is preparing for a flight next week, Pt is concerned the sleeves are too tight. instructed pt how to apply the sleeves and assisted to don it on her in addition to the gauntlet. Both appear to be too tight. Her fingers became dusky and  cool to the touch in the few minutes it took for me to contact Debbie at [facility]. Garments were removed and pt was given a piece of  tg soft to use for the flight if she is not able to get a replacement sleeve and gauntlet.                    Breast Clinic Goals - 04/04/17 1313      Patient will be able to verbalize understanding of pertinent lymphedema risk reduction practices relevant to her diagnosis specifically related to skin care.   Time  1    Period  Days    Status  Achieved      Patient will be  able to return demonstrate and/or verbalize understanding of the post-op home exercise program related to regaining shoulder range of motion.   Time  1    Period  Days    Status  Achieved      Patient will be able to verbalize understanding of the importance of attending the postoperative After Breast Cancer Class for further lymphedema risk reduction education and therapeutic exercise.   Time  1    Period  Days    Status  Achieved       Long Term Clinic Goals - 11/23/17 [phone]      CC Long Term Goal  #1   Title  Pt will report a 65% improvement in right lateral trunk tightness to allow for decreased discomfort    Baseline  09/28/17- 40% improved; on 10/30/17 80% better    Status  Achieved      CC Long Term Goal  #2   Title  Pt will report a 75% improvement in pain in right breast to allow improved function    Baseline  09/28/17- 30% improvement; on 10/30/17 70% better    Status  Partially Met      CC Long Term Goal  #3   Title  Pt to demonstrate 160 degrees of bilateral shoulder flexion to allow pt to reach overhead.    Baseline  R 143, L 154, 09/28/17- R 148, L 160, 10/15/17/ R150. L 160     Status  On-going      CC Long Term Goal  #4   Title  pt to demosntrate 170 degrees of right shoulder abduction to allow pt to reach out to side    Baseline  , 11/23/2017 175 on RT     Status  Achieved      CC Long Term Goal  #5   Title  Pt to be independent in a home exercise program for continued strengthening and stretching.     Status  Achieved      CC Long Term Goal  #6   Title  Pt will report 50% less tenderness from trigger points in axilla and lateral chest and pulling in axilla from cording     Time  8    Period  Weeks    Status  New      Additional Goals   Additional Goals  --  Plan - 11/28/17 1157    Clinical Impression Statement  Pt's sleeve and gauntlet are too tight causing vascular constriction to hand.  Notified Debbie at [facility] and treatment was  shortened so pt could go to [facility] and get back to work on time     Clinical Impairments Affecting Rehab Potential  hx of radiation, pt undergoing hysteroscopy on 09/21/17    PT Next Visit Plan  Remeasure arms after plane flight and determine if she has gotten replacement garments., continue soft tissue work and myofascial release to lateral scapula and axilla with stretching and PROM     Consulted and Agree with Plan of Care  Patient       Patient will benefit from skilled therapeutic intervention in order to improve the following deficits and impairments:  Increased fascial restricitons, Pain, Postural dysfunction, Decreased scar mobility, Decreased strength, Decreased range of motion, Increased edema  Visit Diagnosis: Stiffness of right shoulder, not elsewhere classified  Abnormal posture  Acute pain of right shoulder  Muscle weakness (generalized)  Localized edema  Disorder of the skin and subcutaneous tissue related to radiation, unspecified     Problem List Patient Active Problem List   Diagnosis Date Noted  . Malignant neoplasm of upper-outer quadrant of right breast in female, estrogen receptor positive ([place]) 04/03/2017  . Thrombosed external hemorrhoid 12/04/2011  . [id number](335.3Donato Heinz. Owens Shark PT  Norwood Levo 11/28/2017, 11:59 AM  [place], [place], [address] Phone: [phone]   Fax:  [phone]  Name: Brandi Lewis MRN: [id number] Date of Birth: [birth year]

## 2017-12-11 ENCOUNTER — Ambulatory Visit: Payer: BLUE CROSS/BLUE SHIELD

## 2017-12-11 DIAGNOSIS — M25611 Stiffness of right shoulder, not elsewhere classified: Secondary | ICD-10-CM | POA: Diagnosis not present

## 2017-12-11 DIAGNOSIS — M25511 Pain in right shoulder: Secondary | ICD-10-CM

## 2017-12-11 DIAGNOSIS — R293 Abnormal posture: Secondary | ICD-10-CM

## 2017-12-11 NOTE — Therapy (Signed)
[place], [place], [address] Phone: [phone]   Fax:  [phone]  Physical Therapy Treatment  Patient Details  Name: Brandi Lewis MRN: [id number] Date of Birth: 06-04-1966 Referring Provider: Kathlyn Sacramento Cornett   Encounter Date: 12/11/2017  PT End of Session - 12/11/17 1650    Visit Number  16    Number of Visits  25    Date for PT Re-Evaluation  01/21/18    PT Start Time  1605    PT Stop Time  1649    PT Time Calculation (min)  44 min    Activity Tolerance  Patient tolerated treatment well    Behavior During Therapy  [facility] for tasks assessed/performed       Past Medical History:  Diagnosis Date  . Allergy    codeine  . Breast cancer ([place]) 03/26/2017 bx   right breast   . H/O partial thyroidectomy 2006   nodule  . Headache(784.0)    migraine  . Thrombosed hemorrhoids     Past Surgical History:  Procedure Laterality Date  . BREAST BIOPSY  12/08/2005  . BREAST CYST EXCISION  2007   rt breast  . BREAST EXCISIONAL BIOPSY Right 03/30/2010  . BREAST EXCISIONAL BIOPSY Right 1984  . BREAST LUMPECTOMY WITH RADIOACTIVE SEED AND SENTINEL LYMPH NODE BIOPSY Right 04/30/2017   Procedure: BREAST LUMPECTOMY WITH RADIOACTIVE SEED AND AXILLARY SENTINEL LYMPH NODE BIOPSY;  Surgeon: Erroll Luna, MD;  Location: [facility];  Service: General;  Laterality: Right;  . DILATATION & CURETTAGE/HYSTEROSCOPY WITH MYOSURE N/A 09/21/2017   Procedure: [place];  Surgeon: Servando Salina, MD;  Location: [facility];  Service: Gynecology;  Laterality: N/A;  . THYROID SURGERY  11/02/2005   thyroid exploration with isthmusectomy    There were no vitals filed for this visit.  Subjective Assessment - 12/11/17 1608    Subjective  [facility] was able to give me a sleeve to wear for my trip but she also ordered me another one. My trip went well with no  increased swelling that I can tell.     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 It measures 1.4 cm and is located in the upper outer quadrant. It is ER/PR positive and HER2 negative with a Ki67 of 15%. Had a right lumpectomy on 04/30/17 and completed radiation on 07/18/17. Will undergo a hysteroscopy on 09/21/17 to biopsy some polyps    Patient Stated Goals  to get full ROM of RUE and decrease pain    Currently in Pain?  No/denies         [facility] PT Assessment - 12/11/17 0001      AROM   Right Shoulder Flexion  154 Degrees    Right Shoulder ABduction  179 Degrees    Right Shoulder Internal Rotation  73 Degrees        LYMPHEDEMA/ONCOLOGY QUESTIONNAIRE - 12/11/17 1609      Right Upper Extremity Lymphedema   10 cm Proximal to Olecranon Process  24.2 cm    Olecranon Process  20.9 cm    15 cm Proximal to Ulnar Styloid Process  21.8 cm    10 cm Proximal to Ulnar Styloid Process  20 cm    Just Proximal to Ulnar Styloid Process  12.7 cm    Across Hand at [employer]  16.6 cm    At [place] of  2nd Digit  5.1 cm               [facility] Adult PT Treatment/Exercise - 12/11/17 0001      Manual Therapy   Manual Therapy  Myofascial release;Passive ROM;Neural Stretch    Myofascial Release  to pect on right with unidirectional release, then crosshands in diagonals from right anterior shoulder to abdomen at a couple of different angles; right UE myofascial pulling     Passive ROM  to right shoulder in direction of flexion, abduction, and er to pts tolerance    Neural Stretch  To Rt UE                   Breast Clinic Goals - 04/04/17 1313      Patient will be able to verbalize understanding of pertinent lymphedema risk reduction practices relevant to her diagnosis specifically related to skin care.   Time  1    Period  Days    Status  Achieved      Patient will be able to return demonstrate and/or  verbalize understanding of the post-op home exercise program related to regaining shoulder range of motion.   Time  1    Period  Days    Status  Achieved      Patient will be able to verbalize understanding of the importance of attending the postoperative After Breast Cancer Class for further lymphedema risk reduction education and therapeutic exercise.   Time  1    Period  Days    Status  Achieved       Long Term Clinic Goals - 11/23/17 [phone]      CC Long Term Goal  #1   Title  Pt will report a 65% improvement in right lateral trunk tightness to allow for decreased discomfort    Baseline  09/28/17- 40% improved; on 10/30/17 80% better    Status  Achieved      CC Long Term Goal  #2   Title  Pt will report a 75% improvement in pain in right breast to allow improved function    Baseline  09/28/17- 30% improvement; on 10/30/17 70% better    Status  Partially Met      CC Long Term Goal  #3   Title  Pt to demonstrate 160 degrees of bilateral shoulder flexion to allow pt to reach overhead.    Baseline  R 143, L 154, 09/28/17- R 148, L 160, 10/15/17/ R150. L 160     Status  On-going      CC Long Term Goal  #4   Title  pt to demosntrate 170 degrees of right shoulder abduction to allow pt to reach out to side    Baseline  , 11/23/2017 175 on RT     Status  Achieved      CC Long Term Goal  #5   Title  Pt to be independent in a home exercise program for continued strengthening and stretching.     Status  Achieved      CC Long Term Goal  #6   Title  Pt will report 50% less tenderness from trigger points in axilla and lateral chest and pulling in axilla from cording     Time  8    Period  Weeks    Status  New      Additional Goals   Additional Goals  --         Plan - 12/11/17 1651  Clinical Impression Statement  Pts new compression garments have yet to arrive but she was given a sleeve to wear by [place] for her trip. Her circumference mesaurements were essentially  unchanged if not reduced at all areas except her hand was increased by 0.4 cm and a small area of swelling was noticed near thumb webspace. Pt tolerated stretching well and has much improved since this therapist saw her last. And though her cording is still palpable, it seems to be some less restricting.      Rehab Potential  Good    Clinical Impairments Affecting Rehab Potential  hx of radiation, pt undergoing hysteroscopy on 09/21/17    PT Frequency  2x / week    PT Duration  8 weeks    PT Treatment/Interventions  ADLs/Self Care Home Management;Therapeutic exercise;Therapeutic activities;Patient/family education;Orthotic Fit/Training;Manual lymph drainage;Manual techniques;Scar mobilization;Passive range of motion;Taping    PT Next Visit Plan  Determine if replacement garments are better fit, continue soft tissue work and myofascial release to lateral scapula and axilla with stretching and PROM     Consulted and Agree with Plan of Care  Patient       Patient will benefit from skilled therapeutic intervention in order to improve the following deficits and impairments:  Increased fascial restricitons, Pain, Postural dysfunction, Decreased scar mobility, Decreased strength, Decreased range of motion, Increased edema  Visit Diagnosis: Stiffness of right shoulder, not elsewhere classified  Abnormal posture  Acute pain of right shoulder     Problem List Patient Active Problem List   Diagnosis Date Noted  . Malignant neoplasm of upper-outer quadrant of right breast in female, estrogen receptor positive ([place]) 04/03/2017  . Thrombosed external hemorrhoid 12/04/2011  . QTTCNGFR(432.0Otelia Limes, PTA 12/11/2017, 4:55 PM  [place], [place], [address] Phone: [phone]   Fax:  [phone]  Name: POSEY JASMIN MRN: [id number] Date of Birth: 04-21-1966

## 2017-12-18 ENCOUNTER — Encounter: Payer: BLUE CROSS/BLUE SHIELD | Admitting: Physical Therapy

## 2017-12-25 ENCOUNTER — Encounter: Payer: Self-pay | Admitting: Physical Therapy

## 2017-12-25 ENCOUNTER — Ambulatory Visit: Payer: BLUE CROSS/BLUE SHIELD | Attending: Surgery | Admitting: Physical Therapy

## 2017-12-25 DIAGNOSIS — R293 Abnormal posture: Secondary | ICD-10-CM | POA: Diagnosis present

## 2017-12-25 DIAGNOSIS — L599 Disorder of the skin and subcutaneous tissue related to radiation, unspecified: Secondary | ICD-10-CM

## 2017-12-25 DIAGNOSIS — M25511 Pain in right shoulder: Secondary | ICD-10-CM | POA: Diagnosis present

## 2017-12-25 DIAGNOSIS — M6281 Muscle weakness (generalized): Secondary | ICD-10-CM

## 2017-12-25 DIAGNOSIS — M25611 Stiffness of right shoulder, not elsewhere classified: Secondary | ICD-10-CM

## 2017-12-25 DIAGNOSIS — R600 Localized edema: Secondary | ICD-10-CM | POA: Diagnosis present

## 2017-12-25 NOTE — Therapy (Signed)
[place], [place], [address] Phone: [phone]   Fax:  [phone]  Physical Therapy Treatment  Patient Details  Name: Brandi Lewis MRN: [id number] Date of Birth: 16-Aug-1966 Referring Provider: Kathlyn Sacramento Cornett   Encounter Date: 12/25/2017  PT End of Session - 12/25/17 1649    Visit Number  17    Number of Visits  25    Date for PT Re-Evaluation  01/21/18    PT Start Time  1306    PT Stop Time  1345    PT Time Calculation (min)  39 min    Behavior During Therapy  [facility] for tasks assessed/performed       Past Medical History:  Diagnosis Date  . Allergy    codeine  . Breast cancer ([place]) 03/26/2017 bx   right breast   . H/O partial thyroidectomy 2006   nodule  . Headache(784.0)    migraine  . Thrombosed hemorrhoids     Past Surgical History:  Procedure Laterality Date  . BREAST BIOPSY  12/08/2005  . BREAST CYST EXCISION  2007   rt breast  . BREAST EXCISIONAL BIOPSY Right 03/30/2010  . BREAST EXCISIONAL BIOPSY Right 1984  . BREAST LUMPECTOMY WITH RADIOACTIVE SEED AND SENTINEL LYMPH NODE BIOPSY Right 04/30/2017   Procedure: BREAST LUMPECTOMY WITH RADIOACTIVE SEED AND AXILLARY SENTINEL LYMPH NODE BIOPSY;  Surgeon: Erroll Luna, MD;  Location: [place];  Service: General;  Laterality: Right;  . DILATATION & CURETTAGE/HYSTEROSCOPY WITH MYOSURE N/A 09/21/2017   Procedure: [place];  Surgeon: Servando Salina, MD;  Location: [facility];  Service: Gynecology;  Laterality: N/A;  . THYROID SURGERY  11/02/2005   thyroid exploration with isthmusectomy    There were no vitals filed for this visit.  Subjective Assessment - 12/25/17 1314    Subjective  Pt thinks that she the weather may be affecting her as she feels more pain with cold damp weather     Pertinent History  Patient was diagnosed on 03/16/17 with right grade 2 invasive ductal  carcinoma breast cancer with DCIS. It measures 1.4 cm and is located in the upper outer quadrant. It is ER/PR positive and HER2 negative with a Ki67 of 15%. Had a right lumpectomy on 04/30/17 and completed radiation on 07/18/17. Will undergo a hysteroscopy on 09/21/17 to biopsy some polyps    Patient Stated Goals  to get full ROM of RUE and decrease pain    Currently in Pain?  Yes    Pain Score  4     Pain Location  Axilla                      [facility] Adult PT Treatment/Exercise - 12/25/17 0001      Manual Therapy   Manual Therapy  Soft tissue mobilization;Myofascial release;Passive ROM;Neural Stretch;Taping    Soft tissue mobilization  with biotone to tight pec major and tight muscles in posterior axilla and along posterior lateral chest     Myofascial Release  to pect on right with unidirectional release, then crosshands in diagonals from right anterior shoulder to abdomen at a couple of different angles; right UE myofascial pulling     Passive ROM  to right shoulder in direction of flexion, abduction, and er to pts tolerance    Kinesiotex  Edema      Kinesiotix   Edema  biotone applied to area. then 4 pronged kinesiotape from inguinal area to lateal  chest and  anterior and posterior axilla                    Breast Clinic Goals - 04/04/17 1313      Patient will be able to verbalize understanding of pertinent lymphedema risk reduction practices relevant to her diagnosis specifically related to skin care.   Time  1    Period  Days    Status  Achieved      Patient will be able to return demonstrate and/or verbalize understanding of the post-op home exercise program related to regaining shoulder range of motion.   Time  1    Period  Days    Status  Achieved      Patient will be able to verbalize understanding of the importance of attending the postoperative After Breast Cancer Class for further lymphedema risk reduction education and therapeutic exercise.   Time   1    Period  Days    Status  Achieved       Long Term Clinic Goals - 12/25/17 1653      CC Long Term Goal  #1   Title  Pt will report a 65% improvement in right lateral trunk tightness to allow for decreased discomfort    Baseline  09/28/17- 40% improved; on 10/30/17 80% better    Status  Achieved      CC Long Term Goal  #2   Title  Pt will report a 75% improvement in pain in right breast to allow improved function    Baseline  09/28/17- 30% improvement; on 10/30/17 70% better    Time  4    Period  Weeks    Status  On-going      CC Long Term Goal  #3   Title  Pt to demonstrate 160 degrees of bilateral shoulder flexion to allow pt to reach overhead.    Baseline  R 143, L 154, 09/28/17- R 148, L 160, 10/15/17/ R150. L 160     Time  4    Period  Weeks    Status  On-going      CC Long Term Goal  #4   Title  pt to demosntrate 170 degrees of right shoulder abduction to allow pt to reach out to side    Baseline  , 11/23/2017 175 on RT     Time  4    Status  Achieved      CC Long Term Goal  #5   Title  Pt to be independent in a home exercise program for continued strengthening and stretching.     Status  Achieved      CC Long Term Goal  #6   Time  8    Period  Weeks    Status  On-going         Plan - 12/25/17 1650    Clinical Impression Statement  Pt comes in with exacerbation of fullness at right lateral chest with aching in left pec area.  She has persisent tight tender trigger points at posterior axilla and along latts. Tried kinesiotape today to see if pt can get some relief from fullness at lateral chest     Clinical Impairments Affecting Rehab Potential  hx of radiation, pt undergoing hysteroscopy on 09/21/17    PT Frequency  2x / week    PT Duration  8 weeks    PT Next Visit Plan  check on effect of kinesiotape and continue if beneficial and teach pt  how to apply it.  continue soft tissue work and myofascial release to lateral scapula and axilla with stretching and PROM         Patient will benefit from skilled therapeutic intervention in order to improve the following deficits and impairments:     Visit Diagnosis: Stiffness of right shoulder, not elsewhere classified  Abnormal posture  Acute pain of right shoulder  Muscle weakness (generalized)  Localized edema  Disorder of the skin and subcutaneous tissue related to radiation, unspecified     Problem List Patient Active Problem List   Diagnosis Date Noted  . Malignant neoplasm of upper-outer quadrant of right breast in female, estrogen receptor positive ([place]) 04/03/2017  . Thrombosed external hemorrhoid 12/04/2011  . [id number] Heinz. Owens Shark PT  Norwood Levo 12/25/2017, 4:54 PM  [place], [place], [address] Phone: [phone]   Fax:  [phone]  Name: Brandi Lewis MRN: [id number] Date of Birth: 31-Jul-1966

## 2017-12-25 NOTE — Patient Instructions (Signed)
Kinesiotape should stay on your body for a few days.  It's OK to shower with it on.  Just pat it dry afterwards.    While the tape is on, keep an eye on the skin around it.  If you feel any itching, see any redness or feel in any way that the tape is irritating your skin, it is time to gently remove it.   To loosen the tape from your skin, use something oily like olive oil or baby oil on a cotton ball.  Gently roll the edge of the tape away from the skin.  Then, hold the edge of the tape away from the skin and gently press the skin away from the tape.  Do not pull the tape off the skin or you may cause skin irritation.  Then, inspect your skin.  Gently cleanse with soap and water  

## 2017-12-31 ENCOUNTER — Ambulatory Visit: Payer: BLUE CROSS/BLUE SHIELD | Admitting: Physical Therapy

## 2018-01-01 ENCOUNTER — Ambulatory Visit: Payer: BLUE CROSS/BLUE SHIELD | Admitting: Physical Therapy

## 2018-01-01 ENCOUNTER — Encounter: Payer: Self-pay | Admitting: Physical Therapy

## 2018-01-01 DIAGNOSIS — R293 Abnormal posture: Secondary | ICD-10-CM

## 2018-01-01 DIAGNOSIS — R600 Localized edema: Secondary | ICD-10-CM

## 2018-01-01 DIAGNOSIS — M25611 Stiffness of right shoulder, not elsewhere classified: Secondary | ICD-10-CM

## 2018-01-01 DIAGNOSIS — M6281 Muscle weakness (generalized): Secondary | ICD-10-CM

## 2018-01-01 DIAGNOSIS — M25511 Pain in right shoulder: Secondary | ICD-10-CM

## 2018-01-01 DIAGNOSIS — L599 Disorder of the skin and subcutaneous tissue related to radiation, unspecified: Secondary | ICD-10-CM

## 2018-01-01 NOTE — Therapy (Signed)
[place], [place], [address] Phone: [phone]   Fax:  [phone]  Physical Therapy Treatment  Patient Details  Name: Brandi Lewis MRN: [id number] Date of Birth: [birth year] Referring Provider: Kathlyn Sacramento Cornett   Encounter Date: 01/01/2018  PT End of Session - 01/01/18 1641    Visit Number  18    Number of Visits  25    Date for PT Re-Evaluation  01/21/18    PT Start Time  1345    PT Stop Time  1400    PT Time Calculation (min)  15 min    Activity Tolerance  Patient tolerated treatment well    Behavior During Therapy  [facility] for tasks assessed/performed       Past Medical History:  Diagnosis Date  . Allergy    codeine  . Breast cancer ([place]) 03/26/2017 bx   right breast   . H/O partial thyroidectomy 2006   nodule  . Headache(784.0)    migraine  . Thrombosed hemorrhoids     Past Surgical History:  Procedure Laterality Date  . BREAST BIOPSY  12/08/2005  . BREAST CYST EXCISION  2007   rt breast  . BREAST EXCISIONAL BIOPSY Right 03/30/2010  . BREAST EXCISIONAL BIOPSY Right 1984  . BREAST LUMPECTOMY WITH RADIOACTIVE SEED AND SENTINEL LYMPH NODE BIOPSY Right 04/30/2017   Procedure: BREAST LUMPECTOMY WITH RADIOACTIVE SEED AND AXILLARY SENTINEL LYMPH NODE BIOPSY;  Surgeon: Erroll Luna, MD;  Location: [facility];  Service: General;  Laterality: Right;  . DILATATION & CURETTAGE/HYSTEROSCOPY WITH MYOSURE N/A 09/21/2017   Procedure: [place];  Surgeon: Servando Salina, MD;  Location: [facility];  Service: Gynecology;  Laterality: N/A;  . THYROID SURGERY  11/02/2005   thyroid exploration with isthmusectomy    There were no vitals filed for this visit.  Subjective Assessment - 01/01/18 1353    Subjective  Pt says she was able to keep the kinesiotape for several days and it didn't help Every now and then she felt some shooting pains  going down her side . She has been able to do her arm exercises but has not been doing any walking.  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 It measures 1.4 cm and is located in the upper outer quadrant. It is ER/PR positive and HER2 negative with a Ki67 of 15%. Had a right lumpectomy on 04/30/17 and completed radiation on 07/18/17. Will undergo a hysteroscopy on 09/21/17 to biopsy some polyps    Patient Stated Goals  to get full ROM of RUE and decrease pain    Currently in Pain?  Yes    Pain Score  3     Pain Location  Axilla    Pain Orientation  Right                      [facility] Adult PT Treatment/Exercise - 01/01/18 0001      Shoulder Exercises: Supine   Protraction  AAROM;Right;10 reps    Flexion  AROM;Right    Other Supine Exercises  pupleball at thoracic spine and pt did bilateral shoulder abduciton for front of chest opening with extrea stretch to cordking in this position       Shoulder Exercises: Stretch   Other Shoulder Stretches  right hand in high "  Y" position on door jamb with trunk rotation away to get increased stretch on cord hold for 30 sec       Manual Therapy   Manual therapy comments  thin guitar string cord at [place] right next to tight pec major tendon  Extra time spent on this area with bending of tissue, deep stroking and soft tissue work around it to loosen cording., but it persisted  trigger points at posterior scapula much improved with soft tissue work     Soft tissue mobilization  with biotone to tight pec major and tight muscles in posterior axilla and along posterior lateral chest     Myofascial Release  to pect on right with unidirectional release, then crosshands in diagonals from right anterior shoulder to abdomen at a couple of different angles; right UE myofascial pulling                     Breast Clinic Goals - 04/04/17 1313      Patient will be able to verbalize understanding of pertinent lymphedema risk reduction practices relevant to her diagnosis specifically related to skin care.   Time  1    Period  Days    Status  Achieved      Patient will be able to return demonstrate and/or verbalize understanding of the post-op home exercise program related to regaining shoulder range of motion.   Time  1    Period  Days    Status  Achieved      Patient will be able to verbalize understanding of the importance of attending the postoperative After Breast Cancer Class for further lymphedema risk reduction education and therapeutic exercise.   Time  1    Period  Days    Status  Achieved       Long Term Clinic Goals - 01/01/18 1356      CC Long Term Goal  #1   Title  Pt will report a 65% improvement in right lateral trunk tightness to allow for decreased discomfort  (Pended)     Baseline  09/28/17- 40% improved; on 10/30/17 80% better  (Pended)     Status  Achieved  (Pended)       CC Long Term Goal  #2   Title  Pt will report a 75% improvement in pain in right breast to allow improved function  (Pended)     Baseline  09/28/17- 30% improvement; on 10/30/17 70% better  (Pended)     Time  4  (Pended)     Period  Weeks  (Pended)     Status  On-going  (Pended)       CC Long Term Goal  #3   Title  Pt to demonstrate 160 degrees of bilateral shoulder flexion to allow pt to reach overhead.  (Pended)     Baseline  R 143, L 154, 09/28/17- R 148, L 160, 10/15/17/ R150. L 160   (Pended)          Plan - 01/01/18 1641    Clinical Impression Statement  Pt did not get significant releif with kinesiotape. Worked on soft tissue today especially at lateral axilla which offers relief to tight trigger points.  She has still has guitar string cord near pec major tendon that received stretching in multple ways but was persistent.  Despite this, pt is some better  overall     Clinical Impairments Affecting Rehab Potential  hx of radiation, pt undergoing hysteroscopy on 09/21/17  PT Treatment/Interventions  ADLs/Self Care Home Management;Therapeutic exercise;Therapeutic activities;Patient/family education;Orthotic Fit/Training;Manual lymph drainage;Manual techniques;Scar mobilization;Passive range of motion;Taping    PT Next Visit Plan   continue soft tissue work and myofascial release to lateral scapula and axilla with stretching and PROM with attention to cording     PT Home Exercise Plan  post op breast HEP, supine dowel ex, supine scap series       Patient will benefit from skilled therapeutic intervention in order to improve the following deficits and impairments:  Increased fascial restricitons, Pain, Postural dysfunction, Decreased scar mobility, Decreased strength, Decreased range of motion, Increased edema  Visit Diagnosis: Stiffness of right shoulder, not elsewhere classified  Abnormal posture  Muscle weakness (generalized)  Acute pain of right shoulder  Localized edema  Disorder of the skin and subcutaneous tissue related to radiation, unspecified     Problem List Patient Active Problem List   Diagnosis Date Noted  . Malignant neoplasm of upper-outer quadrant of right breast in female, estrogen receptor positive ([place]) 04/03/2017  . Thrombosed external hemorrhoid 12/04/2011  . [id number](605.6Donato Heinz. Owens Shark PT  Norwood Levo 01/01/2018, 4:44 PM  [place], [place], [address] Phone: [phone]   Fax:  [phone]  Name: DOMINIK LAURICELLA MRN: [id number] Date of Birth: 1966-11-06

## 2018-01-08 ENCOUNTER — Ambulatory Visit: Payer: BLUE CROSS/BLUE SHIELD | Admitting: Physical Therapy

## 2018-01-08 ENCOUNTER — Encounter: Payer: Self-pay | Admitting: Physical Therapy

## 2018-01-08 DIAGNOSIS — M25511 Pain in right shoulder: Secondary | ICD-10-CM

## 2018-01-08 DIAGNOSIS — R600 Localized edema: Secondary | ICD-10-CM

## 2018-01-08 DIAGNOSIS — M25611 Stiffness of right shoulder, not elsewhere classified: Secondary | ICD-10-CM | POA: Diagnosis not present

## 2018-01-08 DIAGNOSIS — M6281 Muscle weakness (generalized): Secondary | ICD-10-CM

## 2018-01-08 DIAGNOSIS — L599 Disorder of the skin and subcutaneous tissue related to radiation, unspecified: Secondary | ICD-10-CM

## 2018-01-08 DIAGNOSIS — R293 Abnormal posture: Secondary | ICD-10-CM

## 2018-01-08 NOTE — Therapy (Signed)
[place], [place], [address] Phone: [phone]   Fax:  [phone]  Physical Therapy Treatment  Patient Details  Name: Brandi Lewis MRN: [id number] Date of Birth: 26-Apr-1966 Referring Provider: Kathlyn Sacramento Cornett   Encounter Date: 01/08/2018  PT End of Session - 01/08/18 1351    Visit Number  19    Number of Visits  25    Date for PT Re-Evaluation  01/21/18    PT Start Time  1300    PT Stop Time  1343    PT Time Calculation (min)  43 min    Activity Tolerance  Patient tolerated treatment well    Behavior During Therapy  [facility] for tasks assessed/performed       Past Medical History:  Diagnosis Date  . Allergy    codeine  . Breast cancer ([place]) 03/26/2017 bx   right breast   . H/O partial thyroidectomy 2006   nodule  . Headache(784.0)    migraine  . Thrombosed hemorrhoids     Past Surgical History:  Procedure Laterality Date  . BREAST BIOPSY  12/08/2005  . BREAST CYST EXCISION  2007   rt breast  . BREAST EXCISIONAL BIOPSY Right 03/30/2010  . BREAST EXCISIONAL BIOPSY Right 1984  . BREAST LUMPECTOMY WITH RADIOACTIVE SEED AND SENTINEL LYMPH NODE BIOPSY Right 04/30/2017   Procedure: BREAST LUMPECTOMY WITH RADIOACTIVE SEED AND AXILLARY SENTINEL LYMPH NODE BIOPSY;  Surgeon: Erroll Luna, MD;  Location: [facility];  Service: General;  Laterality: Right;  . DILATATION & CURETTAGE/HYSTEROSCOPY WITH MYOSURE N/A 09/21/2017   Procedure: [place];  Surgeon: Servando Salina, MD;  Location: [facility];  Service: Gynecology;  Laterality: N/A;  . THYROID SURGERY  11/02/2005   thyroid exploration with isthmusectomy    There were no vitals filed for this visit.  Subjective Assessment - 01/08/18 1307    Subjective  Pt went to the Yoga class last week and it was OK.  She did not have increased pain or soreness after and she did feels a stretch  while doing it.  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 It measures 1.4 cm and is located in the upper outer quadrant. It is ER/PR positive and HER2 negative with a Ki67 of 15%. Had a right lumpectomy on 04/30/17 and completed radiation on 07/18/17. Will undergo a hysteroscopy on 09/21/17 to biopsy some polyps    Patient Stated Goals  to get full ROM of RUE and decrease pain    Currently in Pain?  No/denies just tightness in lateral breast                       [facility] Adult PT Treatment/Exercise - 01/08/18 0001      Manual Therapy   Manual therapy comments  persistent thin tight guitar string cord at pec major tendon that is resistant to release     Soft tissue mobilization  with biotone to tight pec major and tight muscles in posterior axilla and along posterior lateral chest  Extra time spend on tissue movement of right lateral chest     Myofascial Release  to pect on right with unidirectional release, then  crosshands in diagonals from right anterior shoulder to abdomen at a couple of different angles; right UE myofascial pulling     Manual Lymphatic Drainage (MLD)  short neck shoulder circles. right inguinal nodes and right axillo-inguinal anastamosis and posterior anastamosis with extra time spent on lateral chest                    Breast Clinic Goals - 04/04/17 1313      Patient will be able to verbalize understanding of pertinent lymphedema risk reduction practices relevant to her diagnosis specifically related to skin care.   Time  1    Period  Days    Status  Achieved      Patient will be able to return demonstrate and/or verbalize understanding of the post-op home exercise program related to regaining shoulder  range of motion.   Time  1    Period  Days    Status  Achieved      Patient will be able to verbalize understanding of the importance of attending the postoperative After Breast Cancer Class for further lymphedema risk reduction education and therapeutic exercise.   Time  1    Period  Days    Status  Achieved       Long Term Clinic Goals - 01/01/18 1356      CC Long Term Goal  #1   Title  Pt will report a 65% improvement in right lateral trunk tightness to allow for decreased discomfort  (Pended)     Baseline  09/28/17- 40% improved; on 10/30/17 80% better  (Pended)     Status  Achieved  (Pended)       CC Long Term Goal  #2   Title  Pt will report a 75% improvement in pain in right breast to allow improved function  (Pended)     Baseline  09/28/17- 30% improvement; on 10/30/17 70% better  (Pended)     Time  4  (Pended)     Period  Weeks  (Pended)     Status  On-going  (Pended)       CC Long Term Goal  #3   Title  Pt to demonstrate 160 degrees of bilateral shoulder flexion to allow pt to reach overhead.  (Pended)     Baseline  R 143, L 154, 09/28/17- R 148, L 160, 10/15/17/ R150. L 160   (Pended)          Plan - 01/08/18 1732    Clinical Impression Statement  Pt is not feeling well today and has increased fullness in right lateral chest.  This seemed to decrease with soft tissue work, Tight cord persists especially to palpation but does not appear to be as visible as previously.  Pt is making slowe, steady progress     Clinical Impairments Affecting Rehab Potential  hx of radiation, pt undergoing hysteroscopy on 09/21/17    PT Frequency  2x / week    PT Duration  8 weeks    PT Next Visit Plan   continue soft tissue work and myofascial release to lateral scapula and axilla with stretching and PROM with attention to cording     Consulted and Agree with Plan of Care  Patient       Patient will benefit from skilled therapeutic intervention in order to improve the following  deficits and impairments:  Increased fascial restricitons, Pain, Postural dysfunction, Decreased scar mobility, Decreased strength, Decreased range of motion, Increased edema  Visit Diagnosis:  Stiffness of right shoulder, not elsewhere classified  Abnormal posture  Muscle weakness (generalized)  Acute pain of right shoulder  Localized edema  Disorder of the skin and subcutaneous tissue related to radiation, unspecified     Problem List Patient Active Problem List   Diagnosis Date Noted  . Malignant neoplasm of upper-outer quadrant of right breast in female, estrogen receptor positive ([place]) 04/03/2017  . Thrombosed external hemorrhoid 12/04/2011  . [id number](154.8Donato Heinz. Owens Shark PT  Norwood Levo 01/08/2018, 5:34 PM  [place], [place], [address] Phone: [phone]   Fax:  [phone]  Name: Brandi Lewis MRN: [id number] Date of Birth: 11-28-1965

## 2018-01-15 ENCOUNTER — Other Ambulatory Visit: Payer: Self-pay | Admitting: Family Medicine

## 2018-01-15 DIAGNOSIS — R109 Unspecified abdominal pain: Secondary | ICD-10-CM

## 2018-01-17 ENCOUNTER — Encounter: Payer: Self-pay | Admitting: Physical Therapy

## 2018-01-17 ENCOUNTER — Ambulatory Visit: Payer: BLUE CROSS/BLUE SHIELD | Attending: Surgery | Admitting: Physical Therapy

## 2018-01-17 DIAGNOSIS — L599 Disorder of the skin and subcutaneous tissue related to radiation, unspecified: Secondary | ICD-10-CM | POA: Diagnosis present

## 2018-01-17 DIAGNOSIS — M6281 Muscle weakness (generalized): Secondary | ICD-10-CM

## 2018-01-17 DIAGNOSIS — M25611 Stiffness of right shoulder, not elsewhere classified: Secondary | ICD-10-CM

## 2018-01-17 DIAGNOSIS — R600 Localized edema: Secondary | ICD-10-CM | POA: Diagnosis present

## 2018-01-17 DIAGNOSIS — M25511 Pain in right shoulder: Secondary | ICD-10-CM | POA: Diagnosis present

## 2018-01-17 DIAGNOSIS — R293 Abnormal posture: Secondary | ICD-10-CM | POA: Diagnosis present

## 2018-01-17 NOTE — Therapy (Signed)
[place], [place], [address] Phone: [phone]   Fax:  [phone]  Physical Therapy Treatment  Patient Details  Name: Brandi Lewis MRN: [id number] Date of Birth: 1965-12-17 Referring Provider: Kathlyn Sacramento Cornett   Encounter Date: 01/17/2018  PT End of Session - 01/17/18 1211    Visit Number  20    Number of Visits  25    Date for PT Re-Evaluation  01/21/18    PT Start Time  1112    PT Stop Time  1150    PT Time Calculation (min)  38 min    Activity Tolerance  Patient tolerated treatment well    Behavior During Therapy  [facility] for tasks assessed/performed       Past Medical History:  Diagnosis Date  . Allergy    codeine  . Breast cancer ([place]) 03/26/2017 bx   right breast   . H/O partial thyroidectomy 2006   nodule  . Headache(784.0)    migraine  . Thrombosed hemorrhoids     Past Surgical History:  Procedure Laterality Date  . BREAST BIOPSY  12/08/2005  . BREAST CYST EXCISION  2007   rt breast  . BREAST EXCISIONAL BIOPSY Right 03/30/2010  . BREAST EXCISIONAL BIOPSY Right 1984  . BREAST LUMPECTOMY WITH RADIOACTIVE SEED AND SENTINEL LYMPH NODE BIOPSY Right 04/30/2017   Procedure: BREAST LUMPECTOMY WITH RADIOACTIVE SEED AND AXILLARY SENTINEL LYMPH NODE BIOPSY;  Surgeon: Erroll Luna, MD;  Location: [facility];  Service: General;  Laterality: Right;  . DILATATION & CURETTAGE/HYSTEROSCOPY WITH MYOSURE N/A 09/21/2017   Procedure: [place];  Surgeon: Servando Salina, MD;  Location: [facility];  Service: Gynecology;  Laterality: N/A;  . THYROID SURGERY  11/02/2005   thyroid exploration with isthmusectomy    There were no vitals filed for this visit.  Subjective Assessment - 01/17/18 1113    Subjective  Pt is still working to get the correct sleeve.  Nothing else is new.  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 It measures 1.4 cm and is located in the upper outer quadrant. It is ER/PR positive and HER2 negative with a Ki67 of 15%. Had a right lumpectomy on 04/30/17 and completed radiation on 07/18/17. Will undergo a hysteroscopy on 09/21/17 to biopsy some polyps    Patient Stated Goals  to get full ROM of RUE and decrease pain    Pain Score  3     Pain Location  Axilla    Pain Orientation  Right                      [facility] Adult PT Treatment/Exercise - 01/17/18 0001      Lumbar Exercises: Quadruped   [place]  5 reps pt needed extra cues as she had difficulty with this       Shoulder Exercises: Supine   Protraction  AAROM;Right;10 reps      Shoulder Exercises: Sidelying   Other Sidelying Exercises  attempted foam roller for self myofascial release to tight area at posterior shoulder, but did not have success       Shoulder Exercises: Standing   Other Standing Exercises  Push ups with a "plus"  with hands on wall and shoulders at 90  degrees  then also tried reaching up the wall with focus on scapular movement     Other Standing Exercises  one hand on wall, other behing head and twist thoracic spine       Manual Therapy   Soft tissue mobilization  with biotone to tight pec major and tight muscles in posterior axilla and along posterior lateral chest  Extra time spend on tissue movement of right lateral chest     Myofascial Release  to pect on right with unidirectional release and prolonged pressure at posterior axilla trigger point                    Breast Clinic Goals - 04/04/17 1313      Patient will be able to verbalize understanding of pertinent lymphedema risk reduction practices relevant to her diagnosis specifically related to skin care.   Time  1    Period  Days    Status  Achieved      Patient will be  able to return demonstrate and/or verbalize understanding of the post-op home exercise program related to regaining shoulder range of motion.   Time  1    Period  Days    Status  Achieved      Patient will be able to verbalize understanding of the importance of attending the postoperative After Breast Cancer Class for further lymphedema risk reduction education and therapeutic exercise.   Time  1    Period  Days    Status  Achieved       Long Term Clinic Goals - 01/01/18 1356      CC Long Term Goal  #1   Title  Pt will report a 65% improvement in right lateral trunk tightness to allow for decreased discomfort  (Pended)     Baseline  09/28/17- 40% improved; on 10/30/17 80% better  (Pended)     Status  Achieved  (Pended)       CC Long Term Goal  #2   Title  Pt will report a 75% improvement in pain in right breast to allow improved function  (Pended)     Baseline  09/28/17- 30% improvement; on 10/30/17 70% better  (Pended)     Time  4  (Pended)     Period  Weeks  (Pended)     Status  On-going  (Pended)       CC Long Term Goal  #3   Title  Pt to demonstrate 160 degrees of bilateral shoulder flexion to allow pt to reach overhead.  (Pended)     Baseline  R 143, L 154, 09/28/17- R 148, L 160, 10/15/17/ R150. L 160   (Pended)          Plan - 01/17/18 1212    Clinical Impression Statement  Pt is making slow steady progress but continues to have tightness at posterior shoulder and some weakness in serratus anterior with decreased scapular mobility.     Clinical Impairments Affecting Rehab Potential  hx of radiation, pt undergoing hysteroscopy on 09/21/17    PT Next Visit Plan   Reassess for possible discharge continue soft tissue work and myofascial release to lateral scapula and axilla with stretching and PROM with attention to cording     Consulted and Agree with Plan of Care  Patient       Patient will benefit from skilled therapeutic intervention in order to improve the following  deficits and impairments:  Increased fascial restricitons, Pain, Postural dysfunction, Decreased  scar mobility, Decreased strength, Decreased range of motion, Increased edema  Visit Diagnosis: Stiffness of right shoulder, not elsewhere classified  Abnormal posture  Muscle weakness (generalized)  Acute pain of right shoulder  Localized edema  Disorder of the skin and subcutaneous tissue related to radiation, unspecified     Problem List Patient Active Problem List   Diagnosis Date Noted  . Malignant neoplasm of upper-outer quadrant of right breast in female, estrogen receptor positive ([place]) 04/03/2017  . Thrombosed external hemorrhoid 12/04/2011  . WGNFAOZH([id number] Heinz. Owens Shark PT  Norwood Levo 01/17/2018, 12:20 PM  [place], [place], [address] Phone: [phone]   Fax:  [phone]  Name: Brandi Lewis MRN: [id number] Date of Birth: 1966-10-20

## 2018-01-22 ENCOUNTER — Ambulatory Visit: Payer: BLUE CROSS/BLUE SHIELD | Admitting: Physical Therapy

## 2018-01-22 ENCOUNTER — Encounter: Payer: Self-pay | Admitting: Physical Therapy

## 2018-01-22 DIAGNOSIS — L599 Disorder of the skin and subcutaneous tissue related to radiation, unspecified: Secondary | ICD-10-CM

## 2018-01-22 DIAGNOSIS — M6281 Muscle weakness (generalized): Secondary | ICD-10-CM

## 2018-01-22 DIAGNOSIS — M25511 Pain in right shoulder: Secondary | ICD-10-CM

## 2018-01-22 DIAGNOSIS — M25611 Stiffness of right shoulder, not elsewhere classified: Secondary | ICD-10-CM | POA: Diagnosis not present

## 2018-01-22 DIAGNOSIS — R293 Abnormal posture: Secondary | ICD-10-CM

## 2018-01-22 DIAGNOSIS — R600 Localized edema: Secondary | ICD-10-CM

## 2018-01-22 NOTE — Therapy (Signed)
[place], [place], [address] Phone: [phone]   Fax:  [phone]  Physical Therapy Treatment  Patient Details  Name: Brandi Lewis MRN: [id number] Date of Birth: [birth year] Referring Provider: Kathlyn Sacramento Cornett   Encounter Date: 01/22/2018  PT End of Session - 01/22/18 1804    Visit Number  21    Number of Visits  25    Date for PT Re-Evaluation  01/21/18    PT Start Time  4401    PT Stop Time  1345    PT Time Calculation (min)  40 min    Activity Tolerance  Patient tolerated treatment well    Behavior During Therapy  [facility] for tasks assessed/performed       Past Medical History:  Diagnosis Date  . Allergy    codeine  . Breast cancer ([place]) 03/26/2017 bx   right breast   . H/O partial thyroidectomy 2006   nodule  . Headache(784.0)    migraine  . Thrombosed hemorrhoids     Past Surgical History:  Procedure Laterality Date  . BREAST BIOPSY  12/08/2005  . BREAST CYST EXCISION  2007   rt breast  . BREAST EXCISIONAL BIOPSY Right 03/30/2010  . BREAST EXCISIONAL BIOPSY Right 1984  . BREAST LUMPECTOMY WITH RADIOACTIVE SEED AND SENTINEL LYMPH NODE BIOPSY Right 04/30/2017   Procedure: BREAST LUMPECTOMY WITH RADIOACTIVE SEED AND AXILLARY SENTINEL LYMPH NODE BIOPSY;  Surgeon: Erroll Luna, MD;  Location: [facility];  Service: General;  Laterality: Right;  . DILATATION & CURETTAGE/HYSTEROSCOPY WITH MYOSURE N/A 09/21/2017   Procedure: [place];  Surgeon: Servando Salina, MD;  Location: [facility];  Service: Gynecology;  Laterality: N/A;  . THYROID SURGERY  11/02/2005   thyroid exploration with isthmusectomy    There were no vitals filed for this visit.  Subjective Assessment - 01/22/18 1315    Subjective  Pt feels that she has made improvment, but still has some fullness, tightness. and discomfort in right armpit and side.  She feels  that she is ready to continue on her own at home in the hopes that she will continue to improve over time.     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 It measures 1.4 cm and is located in the upper outer quadrant. It is ER/PR positive and HER2 negative with a Ki67 of 15%. Had a right lumpectomy on 04/30/17 and completed radiation on 07/18/17. Will undergo a hysteroscopy on 09/21/17 to biopsy some polyps    Patient Stated Goals  to get full ROM of RUE and decrease pain    Currently in Pain?  Yes    Pain Score  2     Pain Location  Axilla    Pain Orientation  Right    Pain Descriptors / Indicators  Tightness    Pain Type  Chronic pain         [facility] PT Assessment - 01/22/18 0001      Observation/Other Assessments   Quick DASH   11.36           Quick Dash - 01/22/18 0001    Open a tight or new jar  Mild difficulty    Do heavy household chores (wash walls, wash floors)  Mild difficulty    Carry a shopping bag or briefcase  No difficulty    Wash your back  No difficulty  Use a knife to cut food  Mild difficulty    Recreational activities in which you take some force or impact through your arm, shoulder, or hand (golf, hammering, tennis)  Mild difficulty    During the past week, to what extent has your arm, shoulder or hand problem interfered with your normal social activities with family, friends, neighbors, or groups?  Not at all    During the past week, to what extent has your arm, shoulder or hand problem limited your work or other regular daily activities  Not at all    Arm, shoulder, or hand pain.  Mild    Tingling (pins and needles) in your arm, shoulder, or hand  None    Difficulty Sleeping  No difficulty    DASH Score  11.36 %            [facility] Adult PT Treatment/Exercise - 01/22/18 0001      Self-Care   Self-Care  Other Self-Care Comments    Other Self-Care Comments   gave pt information  about [employer], [employer] and [employer] support groups       Manual Therapy   Soft tissue mobilization  with biotone to tight pec major and tight muscles in posterior axilla and along posterior lateral chest  Extra time spend on tissue movement of right lateral chest     Myofascial Release  to pect on right with unidirectional release and prolonged pressure at posterior axilla trigger point                    Breast Clinic Goals - 04/04/17 1313      Patient will be able to verbalize understanding of pertinent lymphedema risk reduction practices relevant to her diagnosis specifically related to skin care.   Time  1    Period  Days    Status  Achieved      Patient will be able to return demonstrate and/or verbalize understanding of the post-op home exercise program related to regaining shoulder range of motion.   Time  1    Period  Days    Status  Achieved      Patient will be able to verbalize understanding of the importance of attending the postoperative After Breast Cancer Class for further lymphedema risk reduction education and therapeutic exercise.   Time  1    Period  Days    Status  Achieved       Long Term Clinic Goals - 01/22/18 1318      CC Long Term Goal  #1   Title  Pt will report a 65% improvement in right lateral trunk tightness to allow for decreased discomfort    Baseline  09/28/17- 40% improved; on 10/30/17 80% better    Status  Achieved      CC Long Term Goal  #2   Title  Pt will report a 75% improvement in pain in right breast to allow improved function    Baseline  09/28/17- 30% improvement; on 10/30/17 70% better 01/22/2018 pt reports it is better     Status  Achieved      CC Long Term Goal  #3   Title  Pt to demonstrate 160 degrees of bilateral shoulder flexion to allow pt to reach overhead.    Baseline  R 143, L 154, 09/28/17- R 148, L 160, 10/15/17/ R150. L 160 , 01/22/2018  R 160     Status  Achieved      CC Long Term  Goal  #4   Title  pt to  demosntrate 170 degrees of right shoulder abduction to allow pt to reach out to side    Baseline  , 11/23/2017 175 on RT     Status  Achieved      CC Long Term Goal  #5   Title  Pt to be independent in a home exercise program for continued strengthening and stretching.     Status  Achieved      CC Long Term Goal  #6   Title  Pt will report 50% less tenderness from trigger points in axilla and lateral chest and pulling in axilla from cording     Baseline  pt still has tenderness and a trigger point at lateral chest and cording is present, but less     Status  Partially Met         Plan - 01/22/18 1805    Clinical Impression Statement  Pt has made functional gains during treament ( quick DASH score decreased from 34.09 to 11.36) and she feels better but she continues to have firmness and trigger points in lateral chest and posterior axillary muscles that gets better, but has been resistent to complete resolution with PT.  She is doing exercises at home and feels that she is ready to increase community exercise and continue her HEP.  She knows that she can come back for another episode of PT if her symptoms persist in a few months.      Clinical Impairments Affecting Rehab Potential  hx of radiation, pt undergoing hysteroscopy on 09/21/17    PT Next Visit Plan  discharge this episode        Patient will benefit from skilled therapeutic intervention in order to improve the following deficits and impairments:  Increased fascial restricitons, Pain, Postural dysfunction, Decreased scar mobility, Decreased strength, Decreased range of motion, Increased edema  Visit Diagnosis: Stiffness of right shoulder, not elsewhere classified  Abnormal posture  Muscle weakness (generalized)  Acute pain of right shoulder  Localized edema  Disorder of the skin and subcutaneous tissue related to radiation, unspecified     Problem List Patient Active Problem List   Diagnosis Date Noted  . Malignant  neoplasm of upper-outer quadrant of right breast in female, estrogen receptor positive ([place]) 04/03/2017  . Thrombosed external hemorrhoid 12/04/2011  . Headache(784.0)    PHYSICAL THERAPY DISCHARGE SUMMARY  Visits from Start of Care: 21  Current functional level related to goals / functional outcomes: As above    Remaining deficits: Pain and sifftness in right axilla and lateral chest    Education / Equipment: Home exercise program, lymphedema risk reduction  Plan: Patient agrees to discharge.  Patient goals were partially met. Patient is being discharged due to being pleased with the current functional level.  ?????    Donato Heinz. Owens Shark PT  Norwood Levo 01/22/2018, 6:09 PM  [place], [place], [address] Phone: [phone]   Fax:  [phone]  Name: ALITHEA LAPAGE MRN: [id number] Date of Birth: 01-Dec-1965

## 2018-01-24 ENCOUNTER — Ambulatory Visit
Admission: RE | Admit: 2018-01-24 | Discharge: 2018-01-24 | Disposition: A | Discharge status: Home / Self Care | Payer: BLUE CROSS/BLUE SHIELD | Source: Ambulatory Visit | Attending: Family Medicine | Admitting: Family Medicine

## 2018-01-24 DIAGNOSIS — R109 Unspecified abdominal pain: Secondary | ICD-10-CM

## 2018-01-29 ENCOUNTER — Encounter: Payer: BLUE CROSS/BLUE SHIELD | Admitting: Physical Therapy

## 2018-02-01 ENCOUNTER — Ambulatory Visit: Payer: BLUE CROSS/BLUE SHIELD

## 2018-02-01 ENCOUNTER — Ambulatory Visit (INDEPENDENT_AMBULATORY_CARE_PROVIDER_SITE_OTHER): Payer: BLUE CROSS/BLUE SHIELD

## 2018-02-01 ENCOUNTER — Encounter: Payer: Self-pay | Admitting: Podiatry

## 2018-02-01 ENCOUNTER — Ambulatory Visit: Payer: BLUE CROSS/BLUE SHIELD | Admitting: Podiatry

## 2018-02-01 ENCOUNTER — Other Ambulatory Visit: Payer: Self-pay | Admitting: Podiatry

## 2018-02-01 VITALS — BP 134/92 | HR 98 | Resp 16

## 2018-02-01 DIAGNOSIS — M779 Enthesopathy, unspecified: Secondary | ICD-10-CM | POA: Diagnosis not present

## 2018-02-01 DIAGNOSIS — M25571 Pain in right ankle and joints of right foot: Secondary | ICD-10-CM | POA: Diagnosis not present

## 2018-02-01 DIAGNOSIS — M25572 Pain in left ankle and joints of left foot: Secondary | ICD-10-CM | POA: Diagnosis not present

## 2018-02-01 DIAGNOSIS — M722 Plantar fascial fibromatosis: Secondary | ICD-10-CM

## 2018-02-01 MED ORDER — TRIAMCINOLONE ACETONIDE 10 MG/ML IJ SUSP
10.0000 mg | Freq: Once | INTRAMUSCULAR | Status: AC
  Administered 2018-02-01: 10 mg

## 2018-02-01 NOTE — Patient Instructions (Signed)

## 2018-02-05 ENCOUNTER — Encounter: Payer: BLUE CROSS/BLUE SHIELD | Admitting: Physical Therapy

## 2018-02-06 NOTE — Progress Notes (Signed)
Subjective:   Patient ID: Brandi Lewis, female   DOB: 52 y.o.   MRN: [id number]   HPI Patient presents with medial side pain of the left ankle along with mild discomfort in the plantar fascia.  States it has been present on a more recent duration and states that it does get tender when she tries to walk.  Patient does like to be active and patient does not smoke   Review of Systems  All other systems reviewed and are negative.       Objective:  Physical Exam  Constitutional: She appears well-developed and well-nourished.  Cardiovascular: Intact distal pulses.  Pulmonary/Chest: Effort normal.  Musculoskeletal: Normal range of motion.  Neurological: She is alert.  Skin: Skin is warm.  Nursing note and vitals reviewed.   Neurovascular status intact muscle strength adequate range of motion within normal limits with patient found to have discomfort in the plantar aspect of the left heel and also in the medial side of the tendon around the posterior tibial as it inserts into the navicular.  I did note moderate depression of the arch bilateral with flatfoot deformity and patient was found to have good digital perfusion and well oriented x3     Assessment:  Appears to be posterior tibial tendinitis along with moderate plantar fascial symptomatology left over right     Plan:  H&P condition reviewed and careful sheath injection administered left 3 mg Kenalog 5 mg Xylocaine advised on ice therapy and applied fascial brace to lift up the arch with instructions for supportive shoes.  If symptoms persist may need to consider orthotic therapy which will be evaluated as needed  X-rays indicate moderate depression of the arch bilateral with no indications that there is any kind of arthritis

## 2018-03-18 ENCOUNTER — Ambulatory Visit
Admission: RE | Admit: 2018-03-18 | Discharge: 2018-03-18 | Disposition: A | Discharge status: Home / Self Care | Payer: BLUE CROSS/BLUE SHIELD | Source: Ambulatory Visit | Attending: Adult Health | Admitting: Adult Health

## 2018-03-18 DIAGNOSIS — C50411 Malignant neoplasm of upper-outer quadrant of right female breast: Secondary | ICD-10-CM

## 2018-03-18 DIAGNOSIS — Z170 Estrogen receptor positive status [ER+]: Secondary | ICD-10-CM

## 2018-03-18 HISTORY — DX: Personal history of irradiation: Z92.3

## 2018-04-18 ENCOUNTER — Inpatient Hospital Stay: Payer: BLUE CROSS/BLUE SHIELD | Attending: Hematology and Oncology | Admitting: Hematology and Oncology

## 2018-04-18 DIAGNOSIS — C50411 Malignant neoplasm of upper-outer quadrant of right female breast: Secondary | ICD-10-CM | POA: Diagnosis not present

## 2018-04-18 DIAGNOSIS — Z79810 Long term (current) use of selective estrogen receptor modulators (SERMs): Secondary | ICD-10-CM | POA: Diagnosis not present

## 2018-04-18 DIAGNOSIS — Z170 Estrogen receptor positive status [ER+]: Secondary | ICD-10-CM | POA: Insufficient documentation

## 2018-04-18 NOTE — Progress Notes (Signed)
Patient Care Team: [name], MD as PCP - General (Family Medicine) Servando Salina, MD as Consulting Physician (Obstetrics and Gynecology) Erroll Luna, MD as Consulting Physician (General Surgery) Nicholas Lose, MD as Consulting Physician (Hematology and Oncology) Kyung Rudd, MD as Consulting Physician (Radiation Oncology) Delice Bison Charlestine Massed, NP as Nurse Practitioner (Hematology and Oncology)  DIAGNOSIS:  Encounter Diagnosis  Name Primary?  . Malignant neoplasm of upper-outer quadrant of right breast in female, estrogen receptor positive ([place])     SUMMARY OF ONCOLOGIC HISTORY:   Malignant neoplasm of upper-outer quadrant of right breast in female, estrogen receptor positive ([place])   03/26/2017 Initial Diagnosis    Screening detected right breast breast density 1.4 cm, axilla negative, biopsy: Grade 2 IDC with DCIS ER 90%, PR 90%, HER-2 negative ratio 1.05, Ki-67 15%, T1c N0 stage IA clinical stage      04/30/2017 Surgery    Right lumpectomy: IDC grade 1, 1.4 cm, DCIS grade 1, margins negative, N0 stage IA, ER 90%, PR 90%, HER-2 negative, Ki-67 15%      05/04/2017 Oncotype testing    Oncotype score 16, low risk, 10% ROR      05/31/2017 - 07/13/2017 Radiation Therapy    Adjuvant radiation therapy      07/2017 -  Anti-estrogen oral therapy    Tamoxifen daily       CHIEF COMPLIANT: Follow-up on tamoxifen therapy  INTERVAL HISTORY: Brandi Lewis is a 52 year old with above-mentioned history of right breast cancer who is currently on tamoxifen.  She is tolerating it fairly well.  She denies any hot flashes or myalgias.  Denies any lumps or nodules in the breast.  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  REVIEW OF SYSTEMS:   Constitutional: Denies fevers, chills or abnormal weight loss Eyes: Denies blurriness of vision Ears, nose, mouth, throat, and face: Denies mucositis or sore throat Respiratory: Denies cough, dyspnea or  wheezes Cardiovascular: Denies palpitation, chest discomfort Gastrointestinal:  Denies nausea, heartburn or change in bowel habits Skin: Denies abnormal skin rashes Lymphatics: Denies new lymphadenopathy or easy bruising Neurological:Denies numbness, tingling or new weaknesses Behavioral/Psych: Mood is stable, no new changes  Extremities: No lower extremity edema Breast:  denies any pain or lumps or nodules in either breasts All other systems were reviewed with the patient and are negative.  I have reviewed the past medical history, past surgical history, social history and family history with the patient and they are unchanged from previous note.  ALLERGIES:  is allergic to codeine.  MEDICATIONS:  Current Outpatient Medications  Medication Sig Dispense Refill  . docusate sodium (COLACE) 100 MG capsule Take 100 mg daily by mouth.    . promethazine (PHENERGAN) 25 MG tablet Take 25 mg every 6 (six) hours as needed by mouth for nausea or vomiting.    . tamoxifen (NOLVADEX) 20 MG tablet Take 1 tablet (20 mg total) by mouth daily. 90 tablet 3  . tiZANidine (ZANAFLEX) 4 MG capsule Take 4 mg daily as needed by mouth for muscle spasms.     . Topiramate ER (QUDEXY XR) 200 MG CS24 sprinkle capsule Take 200 mg daily by mouth.     No current facility-administered medications for this visit.     PHYSICAL EXAMINATION: ECOG PERFORMANCE STATUS: 1 - Symptomatic but completely ambulatory  Vitals:   04/18/18 0821  BP: 116/76  Pulse: 83  Resp: 18  Temp: 98.3 F (36.8 C)  SpO2: 100%   Filed Weights   04/18/18 8889  Weight: 111 lb 4.8 oz (50.5 kg)    GENERAL:alert, no distress and comfortable SKIN: skin color, texture, turgor are normal, no rashes or significant lesions EYES: normal, Conjunctiva are pink and non-injected, sclera clear OROPHARYNX:no exudate, no erythema and lips, buccal mucosa, and tongue normal  NECK: supple, thyroid normal size, non-tender, without nodularity LYMPH:  no  palpable lymphadenopathy in the cervical, axillary or inguinal LUNGS: clear to auscultation and percussion with normal breathing effort HEART: regular rate & rhythm and no murmurs and no lower extremity edema ABDOMEN:abdomen soft, non-tender and normal bowel sounds MUSCULOSKELETAL:no cyanosis of digits and no clubbing  NEURO: alert & oriented x 3 with fluent speech, no focal motor/sensory deficits EXTREMITIES: No lower extremity edema BREAST: No palpable masses or nodules in either right or left breasts. No palpable axillary supraclavicular or infraclavicular adenopathy no breast tenderness or nipple discharge. (exam performed in the presence of a chaperone)  LABORATORY DATA:  I have reviewed the data as listed CMP Latest Ref Rng & Units 04/04/2017 09/27/2016 01/31/2015  Glucose 70 - 140 mg/dl 100 87 98  BUN 7.0 - 26.0 mg/dL 10._0 Creatinine 0.6 - 1.1 mg/dL 1.1 1.10(H) 0.99  Sodium 136 - 145 mEq/L 143 142 138  Potassium 3.5 - 5.1 mEq/L 4.1 4.4 4.3  Chloride 101 - 111 mmol/L - 112(H) 106  CO2 22 - 29 mEq/L 21(L) - 22  Calcium 8.4 - 10.4 mg/dL 9.5 - 9.9  Total Protein 6.4 - 8.3 g/dL 7.5 - 7.4  Total Bilirubin 0.20 - 1.20 mg/dL 0.47 - 0.6  Alkaline Phos 40 - 150 U/L 70 - 42  AST 5 - 34 U/L 31 - 15  ALT 0 - 55 U/L 34 - 12    Lab Results  Component Value Date   WBC 4.9 09/21/2017   HGB 15.4 (H) 09/21/2017   HCT 44.2 09/21/2017   MCV 87.9 09/21/2017   PLT 162 09/21/2017   NEUTROABS 1.9 04/04/2017    ASSESSMENT & PLAN:  Malignant neoplasm of upper-outer quadrant of right breast in female, estrogen receptor positive ([place]) 04/30/2017 Right lumpectomy: IDC grade 1, 1.4 cm, DCIS grade 1, margins negative, N0 stage IA, ER 90%, PR 90%, HER-2 negative, Ki-67 15% Oncotype DX score 16, low risk, 10% ROR Adjuvant radiation 05/31/2017 completed 07/12/2017  Current treatment: Antiestrogen therapy with tamoxifen 20 mg daily started 07/17/2017 with a plan to switch to anastrozole or letrozole  once she is postmenopausal  Tamoxifen toxicities: Denies any side effects to tamoxifen therapy.  Denies any hot flashes or myalgias.  Breast cancer surveillance: 1.  Breast exam 04/18/2018: Benign 2. Mammogram 03/18/2018: No mammographic evidence of malignancy, breast density category C  Return to clinic in 1 year for follow-up   No orders of the defined types were placed in this encounter.  The patient has a good understanding of the overall plan. she agrees with it. she will call with any problems that may develop before the next visit here.   Harriette Ohara, MD 04/18/18

## 2018-04-18 NOTE — Assessment & Plan Note (Signed)
04/30/2017 Right lumpectomy: IDC grade 1, 1.4 cm, DCIS grade 1, margins negative, N0 stage IA, ER 90%, PR 90%, HER-2 negative, Ki-67 15% Oncotype DX score 16, low risk, 10% ROR Adjuvant radiation 05/31/2017 completed 07/12/2017  Current treatment: Antiestrogen therapy with tamoxifen 20 mg daily started 07/17/2017 with a plan to switch to anastrozole or letrozole once she is postmenopausal  Tamoxifen toxicities:   Breast cancer surveillance: 1.  Breast exam 04/18/2018: Benign 2. Mammogram 03/18/2018: No mammographic evidence of malignancy, breast density category C  Return to clinic in 1 year for follow-up

## 2018-04-19 ENCOUNTER — Telehealth: Payer: Self-pay | Admitting: Hematology and Oncology

## 2018-04-19 NOTE — Telephone Encounter (Signed)
Mailed patient calendar of upcoming June 2020 appointments

## 2018-05-21 ENCOUNTER — Encounter (HOSPITAL_COMMUNITY): Payer: Self-pay | Admitting: *Deleted

## 2018-05-21 ENCOUNTER — Emergency Department (HOSPITAL_COMMUNITY)
Admission: EM | Admit: 2018-05-21 | Discharge: 2018-05-22 | Disposition: A | Discharge status: Home / Self Care | Payer: BLUE CROSS/BLUE SHIELD | Attending: Emergency Medicine | Admitting: Emergency Medicine

## 2018-05-21 ENCOUNTER — Other Ambulatory Visit: Payer: Self-pay

## 2018-05-21 DIAGNOSIS — R42 Dizziness and giddiness: Secondary | ICD-10-CM | POA: Diagnosis not present

## 2018-05-21 DIAGNOSIS — Z79899 Other long term (current) drug therapy: Secondary | ICD-10-CM | POA: Diagnosis not present

## 2018-05-21 NOTE — ED Triage Notes (Signed)
Pt reports onset of dizziness and vomiting that started tonight after washing her hair. Worse to position changes, like standing up. Slight headache.

## 2018-05-22 LAB — I-STAT CHEM 8, ED
BUN: 14 mg/dL (ref 6–20)
CHLORIDE: 107 mmol/L (ref 98–111)
Calcium, Ion: 1.21 mmol/L (ref 1.15–1.40)
Creatinine, Ser: 0.8 mg/dL (ref 0.44–1.00)
Glucose, Bld: 125 mg/dL — ABNORMAL HIGH (ref 70–99)
HCT: 45 % (ref 36.0–46.0)
Hemoglobin: 15.3 g/dL — ABNORMAL HIGH (ref 12.0–15.0)
POTASSIUM: 3.4 mmol/L — AB (ref 3.5–5.1)
Sodium: 141 mmol/L (ref 135–145)
TCO2: 21 mmol/L — ABNORMAL LOW (ref 22–32)

## 2018-05-22 MED ORDER — SODIUM CHLORIDE 0.9 % IV BOLUS (SEPSIS)
1000.0000 mL | Freq: Once | INTRAVENOUS | Status: AC
  Administered 2018-05-22: 1000 mL via INTRAVENOUS

## 2018-05-22 MED ORDER — DIPHENHYDRAMINE HCL 50 MG/ML IJ SOLN
25.0000 mg | Freq: Once | INTRAMUSCULAR | Status: AC
  Administered 2018-05-22: 25 mg via INTRAVENOUS
  Filled 2018-05-22: qty 1

## 2018-05-22 MED ORDER — MECLIZINE HCL 25 MG PO TABS: 25.0000 mg | ORAL_TABLET | Freq: Three times a day (TID) | ORAL | 0 refills | Status: DC | PRN

## 2018-05-22 MED ORDER — METOCLOPRAMIDE HCL 5 MG/ML IJ SOLN
10.0000 mg | Freq: Once | INTRAMUSCULAR | Status: AC
  Administered 2018-05-22: 10 mg via INTRAVENOUS
  Filled 2018-05-22: qty 2

## 2018-05-22 MED ORDER — ONDANSETRON 8 MG PO TBDP
8.0000 mg | ORAL_TABLET | Freq: Once | ORAL | Status: AC
  Administered 2018-05-22: 8 mg via ORAL
  Filled 2018-05-22: qty 1

## 2018-05-22 NOTE — ED Notes (Signed)
Pt. Ambulated to the bathroom and back to her room without difficulty. Pt. Gait steady on her feet. Pt. Has no complaints at this time.

## 2018-05-22 NOTE — ED Provider Notes (Signed)
[facility] DEPT Provider Note   CSN: [id number] Arrival date & time: 05/21/18  2311     History   Chief Complaint Chief Complaint  Patient presents with  . Dizziness    HPI Brandi Lewis is a 52 y.o. female.  The history is provided by the patient.  Dizziness  Quality:  Room spinning Severity:  Severe Onset quality:  Sudden Timing:  Intermittent Progression:  Worsening Chronicity:  New Context: head movement   Relieved by:  Being still Worsened by:  Movement Associated symptoms: headaches, nausea and vomiting   Associated symptoms: no chest pain, no hearing loss, no syncope, no tinnitus, no vision changes and no weakness   Risk factors: no hx of stroke    Patient reports sudden onset of dizziness.  No head trauma.  No fevers.  No falls.  No history of stroke.  She reports history of migraines and she has a headache at this time. Past Medical History:  Diagnosis Date  . Allergy    codeine  . Breast cancer ([place]) 03/26/2017 bx   right breast   . H/O partial thyroidectomy 2006   nodule  . Headache(784.0)    migraine  . Personal history of radiation therapy 2018   Right Breast Cancer  . Thrombosed hemorrhoids     Patient Active Problem List   Diagnosis Date Noted  . Malignant neoplasm of upper-outer quadrant of right breast in female, estrogen receptor positive ([place]) 04/03/2017  . Thrombosed external hemorrhoid 12/04/2011  . IEPPIRJJ(884.1)     Past Surgical History:  Procedure Laterality Date  . BREAST BIOPSY  12/08/2005  . BREAST CYST EXCISION  2007   rt breast  . BREAST EXCISIONAL BIOPSY Right 03/30/2010  . BREAST EXCISIONAL BIOPSY Right 1984  . BREAST LUMPECTOMY Right 2018  . BREAST LUMPECTOMY WITH RADIOACTIVE SEED AND SENTINEL LYMPH NODE BIOPSY Right 04/30/2017   Procedure: BREAST LUMPECTOMY WITH RADIOACTIVE SEED AND AXILLARY SENTINEL LYMPH NODE BIOPSY;  Surgeon: Erroll Luna, MD;  Location: [facility];  Service: General;  Laterality: Right;  . DILATATION & CURETTAGE/HYSTEROSCOPY WITH MYOSURE N/A 09/21/2017   Procedure: [place];  Surgeon: Servando Salina, MD;  Location: [facility];  Service: Gynecology;  Laterality: N/A;  . THYROID SURGERY  11/02/2005   thyroid exploration with isthmusectomy     OB History   None      Home Medications    Prior to Admission medications   Medication Sig Start Date End Date Taking? Authorizing Provider  cholecalciferol (VITAMIN D) 1000 units tablet Take 1,000 Units by mouth at bedtime.   Yes [provider]  docusate sodium (COLACE) 100 MG capsule Take 100 mg by mouth at bedtime.    Yes [provider]  promethazine (PHENERGAN) 25 MG tablet Take 25 mg every 6 (six) hours as needed by mouth for nausea or vomiting.   Yes [provider]  tamoxifen (NOLVADEX) 20 MG tablet Take 1 tablet (20 mg total) by mouth daily. Patient taking differently: Take 20 mg by mouth at bedtime.  07/17/17  Yes Nicholas Lose, MD  tiZANidine (ZANAFLEX) 4 MG capsule Take 4 mg daily as needed by mouth for muscle spasms.    Yes [provider]  Topiramate ER (QUDEXY XR) 200 MG CS24 sprinkle capsule Take 200 mg by mouth at bedtime.    Yes [provider]    Family History Family History  Problem Relation Age of Onset  . Hypertension Mother   .  Bone cancer Father   . Prostate cancer Father   . Colon cancer Maternal Grandfather     Social History Social History   Tobacco Use  . Smoking status: Never Smoker  . Smokeless tobacco: Never Used  Substance Use Topics  . Alcohol use: No  . Drug use: No     Allergies   Codeine   Review of Systems Review of Systems  HENT: Negative for hearing loss and tinnitus.   Eyes: Negative for visual disturbance.  Cardiovascular: Negative for chest pain and syncope.  Gastrointestinal: Positive for nausea and vomiting.  Neurological: Positive  for dizziness and headaches. Negative for syncope and weakness.  All other systems reviewed and are negative.    Physical Exam Updated Vital Signs BP 136/80 (BP Location: Right Arm)   Pulse 98   Temp 98.4 F (36.9 C)   Resp 16   Ht 1.524 m (5')   Wt 50.8 kg (112 lb)   SpO2 100%   BMI 21.87 kg/m   Physical Exam CONSTITUTIONAL: Well developed/well nourished HEAD: Normocephalic/atraumatic EYES: EOMI/PERRL, no nystagmus,no ptosis ENMT: Mucous membranes moist NECK: supple no meningeal signs, no bruits CV: S1/S2 noted, no murmurs/rubs/gallops noted LUNGS: Lungs are clear to auscultation bilaterally, no apparent distress ABDOMEN: soft, nontender, no rebound or guarding GU:no cva tenderness NEURO:Awake/alert, face symmetric, no arm or leg drift is noted Equal 5/5 strength with shoulder abduction Equal 5/5 strength with hip flexion,knee flex/extension Cranial nerves 3/4/5/6/05/21/09/11/12 tested and intact No past pointing Sensation to light touch intact in all extremities EXTREMITIES: pulses normal, full ROM SKIN: warm, color normal PSYCH: no abnormalities of mood noted   ED Treatments / Results  Labs (all labs ordered are listed, but only abnormal results are displayed) Labs Reviewed  I-STAT CHEM 8, ED - Abnormal; Notable for the following components:      Result Value   Potassium 3.4 (*)    Glucose, Bld 125 (*)    TCO2 21 (*)    Hemoglobin 15.3 (*)    All other components within normal limits    EKG EKG Interpretation  Date/Time:  Wednesday May 22 2018 01:37:33 EDT Ventricular Rate:  95 PR Interval:    QRS Duration: 89 QT Interval:  356 QTC Calculation: 448 R Axis:   83 Text Interpretation:  Sinus rhythm Normal ECG No previous ECGs available Confirmed by Ripley Fraise [phone]) on 05/22/2018 2:08:45 AM   Radiology No results found.  Procedures Procedures    Medications Ordered in ED Medications  metoCLOPramide (REGLAN) injection 10 mg (10 mg  Intravenous Given 05/22/18 0215)  diphenhydrAMINE (BENADRYL) injection 25 mg (25 mg Intravenous Given 05/22/18 0215)  sodium chloride 0.9 % bolus 1,000 mL (0 mLs Intravenous Stopped 05/22/18 0245)  ondansetron (ZOFRAN-ODT) disintegrating tablet 8 mg (8 mg Oral Given 05/22/18 0215)     Initial Impression / Assessment and Plan / ED Course  I have reviewed the triage vital signs and the nursing notes.  Pertinent labs results that were available during my care of the patient were reviewed by me and considered in my medical decision making (see chart for details).     Presented for episodes of acute vertigo.  She had no focal neurologic symptoms.  After medications, she could ambulate well.  Her headache is improved, she has a history of migraines.  Nausea is also improved.  No vomiting noted now.  I gave the patient option of further monitoring and treatment, but she elected to go home.  I have a strong  suspicion for peripheral vertigo, as I feel she is very low risk for stroke.  I doubt any other acute neurologic emergency causing the symptoms.  Will prescribe Antivert and we discussed strict ER return precautions  Final Clinical Impressions(s) / ED Diagnoses   Final diagnoses:  Vertigo    ED Discharge Orders        Ordered    meclizine (ANTIVERT) 25 MG tablet  3 times daily PRN     05/22/18 0439       Ripley Fraise, MD 05/22/18 [phone]

## 2018-05-22 NOTE — ED Notes (Signed)
Patient denies diarrhea or any abdominal pain. Patient states she "feels like the room is spinning" when she starts to move around.

## 2018-05-22 NOTE — Discharge Instructions (Addendum)

## 2018-08-12 ENCOUNTER — Other Ambulatory Visit: Payer: Self-pay | Admitting: Hematology and Oncology

## 2018-09-14 ENCOUNTER — Other Ambulatory Visit: Payer: Self-pay | Admitting: Hematology and Oncology

## 2018-11-15 ENCOUNTER — Other Ambulatory Visit: Payer: Self-pay | Admitting: Hematology and Oncology

## 2019-02-11 ENCOUNTER — Other Ambulatory Visit: Payer: Self-pay | Admitting: Adult Health

## 2019-02-11 DIAGNOSIS — Z853 Personal history of malignant neoplasm of breast: Secondary | ICD-10-CM

## 2019-02-12 ENCOUNTER — Other Ambulatory Visit: Payer: Self-pay | Admitting: Hematology and Oncology

## 2019-02-12 IMAGING — MG 2D DIGITAL DIAGNOSTIC UNILATERAL RIGHT MAMMOGRAM WITH CAD AND AD
8 of 12 series · 8 of 28 positions shown · non-contrast
Comparison: Previous exams including recent screening mammogram
dated 03/16/2017.

ADDENDUM:
The right axilla was scanned at the time of biopsy on 03/26/2017.
There is no evidence of right axillary lymphadenopathy.
CLINICAL DATA: Patient returns today to evaluate a possible right
breast asymmetry questioned on recent screening mammogram.

History of benign right breast excisional biopsies x2.
EXAM:
2D DIGITAL DIAGNOSTIC RIGHT MAMMOGRAM WITH CAD AND ADJUNCT TOMO
ULTRASOUND RIGHT BREAST

[R MLO (1 of 2)]
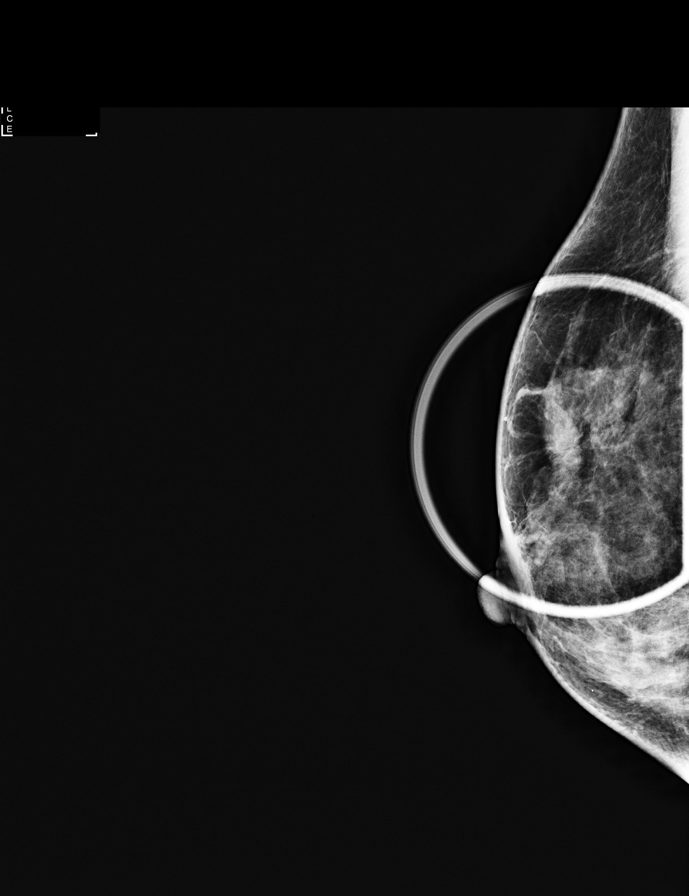

[R XCCL]
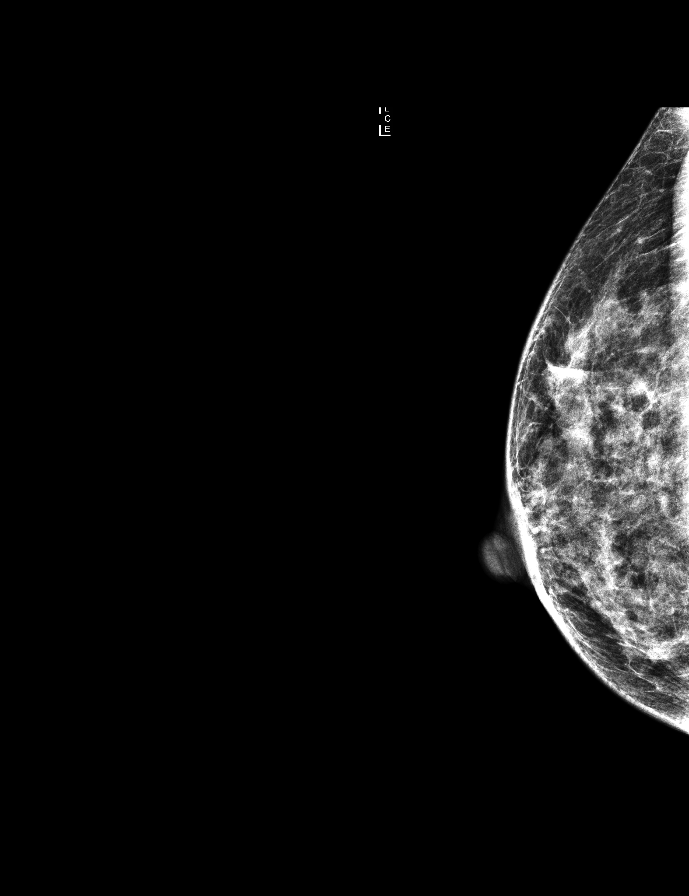

[R ML]
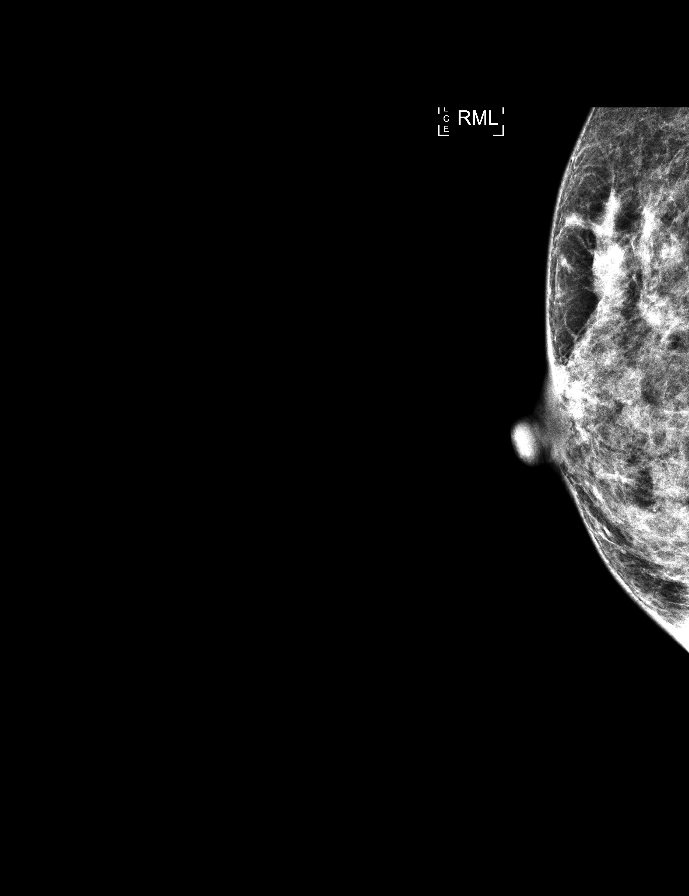

[R XCCL synth-2D]
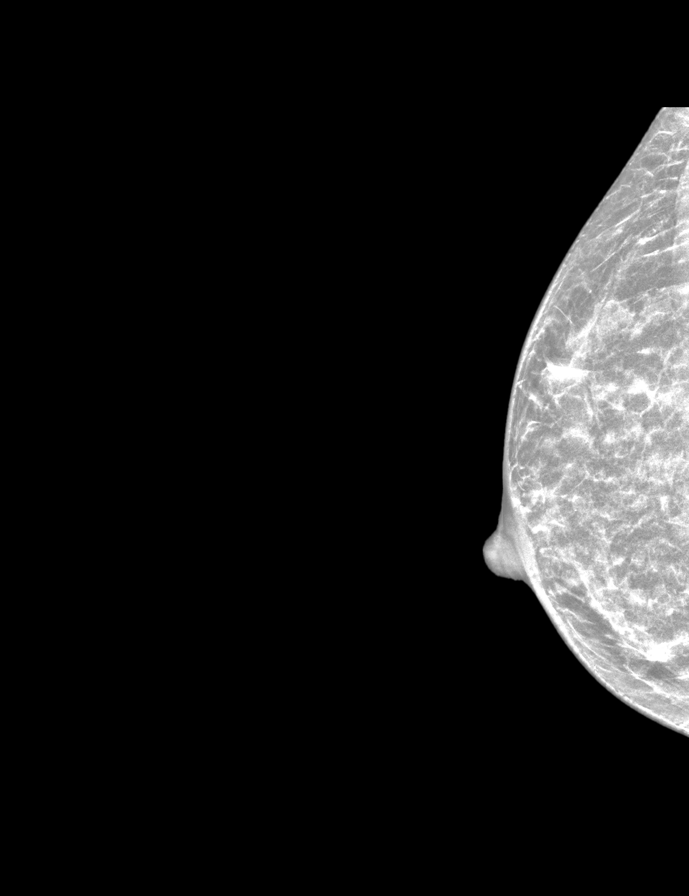

[R MLO synth-2D (1 of 2)]
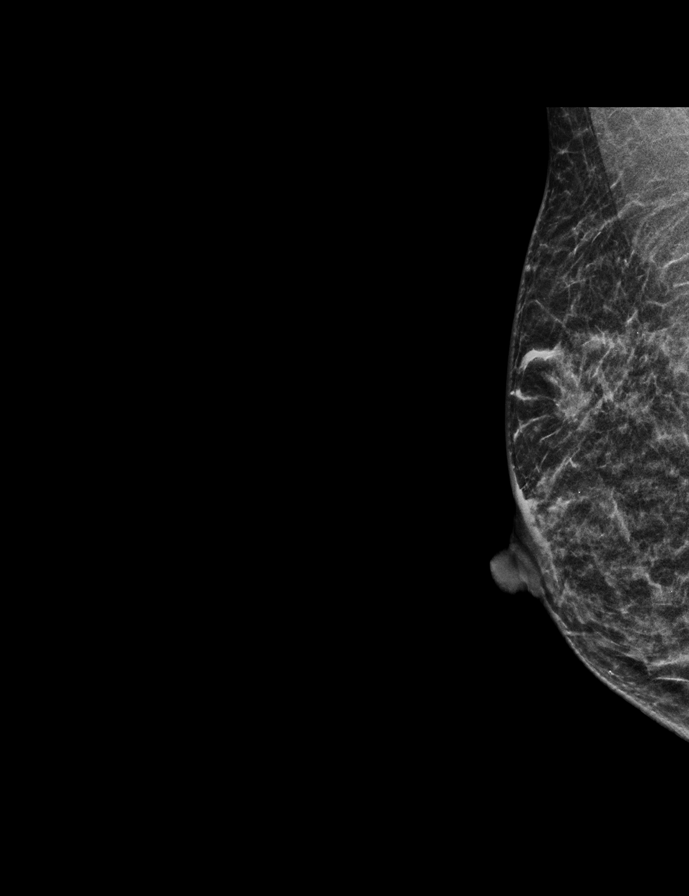

[R ML synth-2D]
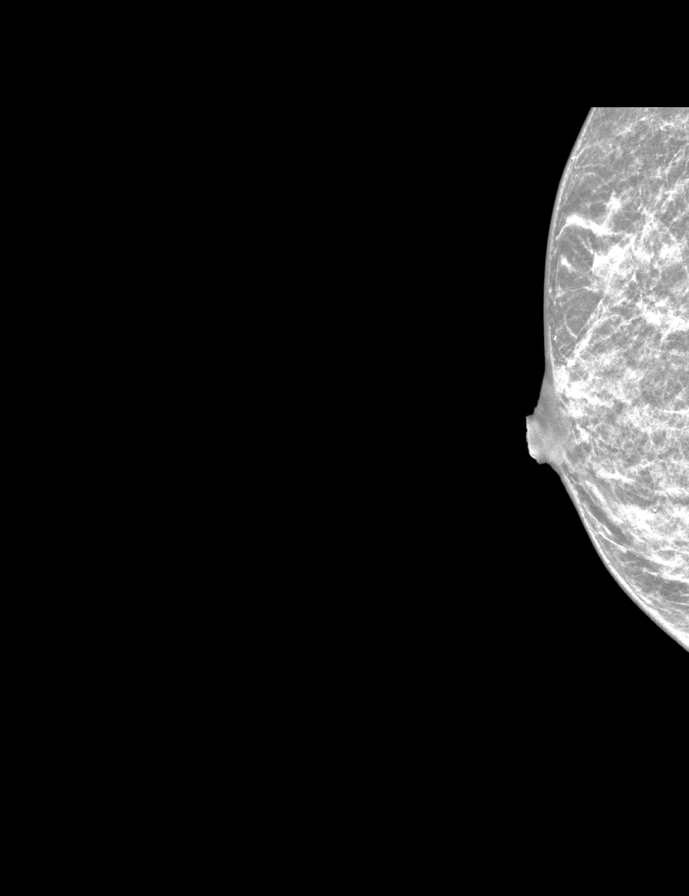

[R MLO (2 of 2)]
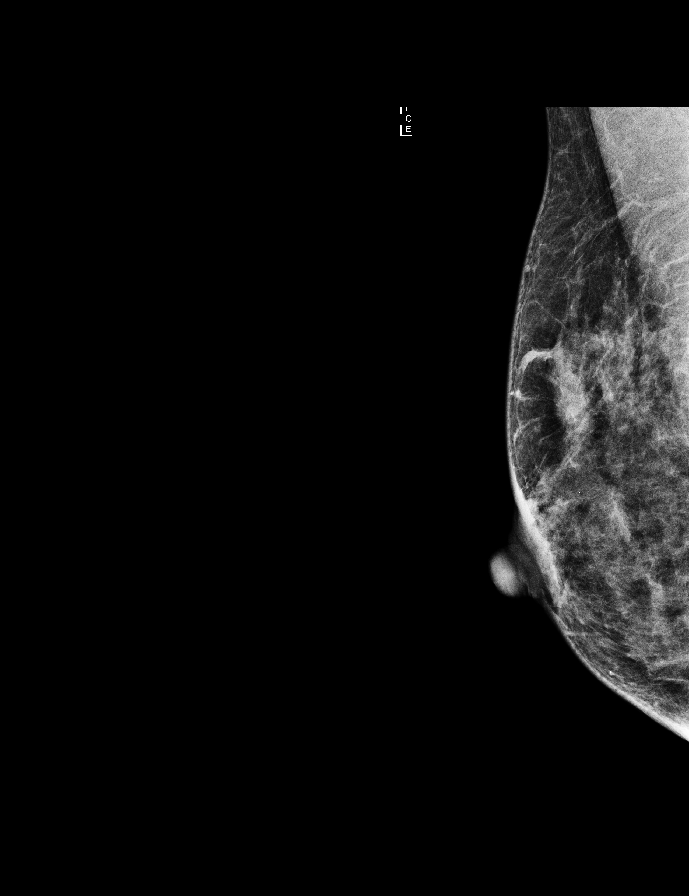

[R MLO synth-2D (2 of 2)]
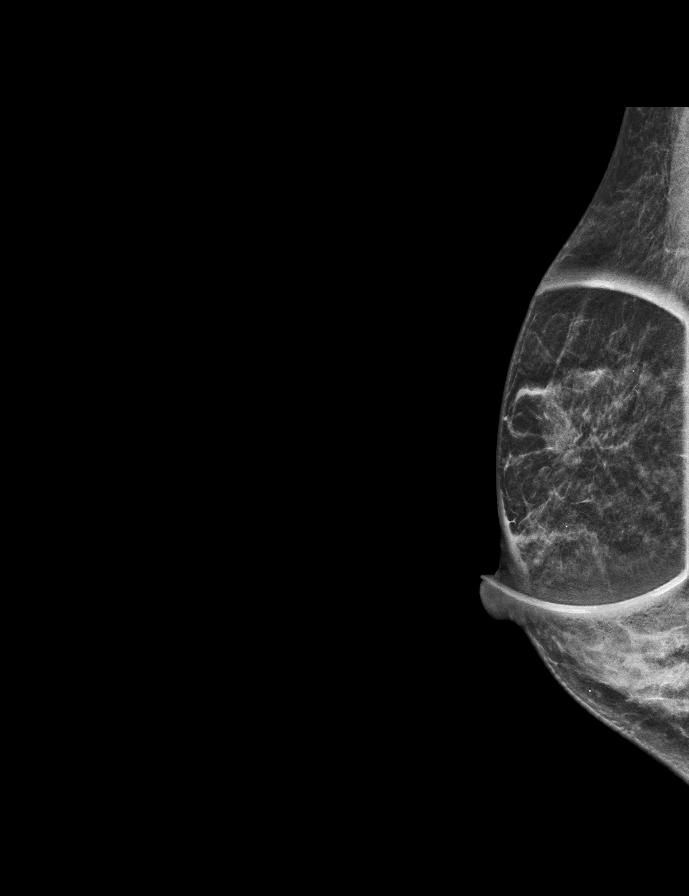

[8 of 28 positions shown; findings below may reference images not displayed]

ACR Breast Density Category c: The breast tissue is heterogeneously
dense, which may obscure small masses.
FINDINGS: On today's additional views with spot compression and 3D
tomosynthesis, there is a persistent asymmetry within the upper
right breast, with surrounding distortion compatible with patient's
history of previous surgical excision.

Mammographic images were processed with CAD.

Targeted ultrasound is performed, showing an irregular hypoechoic
mass in the right breast at the 11 o'clock axis, 2 cm from the
nipple, with internal vascularity, measuring 1.4 x 0.7 x 0.7 cm, a
possible correlate for the mammographic finding.
IMPRESSION: Irregular mass in the right breast at the 11 o'clock axis, 2 cm from
the nipple, measuring 1.4 x 0.7 x 0.7 cm, a possible correlate for
the mammographic asymmetry. Ultrasound-guided biopsy is recommended

RECOMMENDATION:
Ultrasound-guided biopsy of the right breast mass at the 11 o'clock
axis.

Ultrasound-guided biopsy is scheduled for [REDACTED] at [DATE] p.m.

I have discussed the findings and recommendations with the patient.
Results were also provided in writing at the conclusion of the
visit. If applicable, a reminder letter will be sent to the patient
regarding the next appointment.

BI-RADS CATEGORY  4: Suspicious.

## 2019-03-21 ENCOUNTER — Other Ambulatory Visit: Payer: Self-pay

## 2019-03-21 ENCOUNTER — Ambulatory Visit
Admission: RE | Admit: 2019-03-21 | Discharge: 2019-03-21 | Disposition: A | Discharge status: Home / Self Care | Payer: PRIVATE HEALTH INSURANCE | Source: Ambulatory Visit | Attending: Adult Health | Admitting: Adult Health

## 2019-03-21 DIAGNOSIS — Z853 Personal history of malignant neoplasm of breast: Secondary | ICD-10-CM

## 2019-04-16 ENCOUNTER — Telehealth: Payer: Self-pay | Admitting: Hematology and Oncology

## 2019-04-16 NOTE — Telephone Encounter (Signed)
I did leave a message about phone visit on 6/11

## 2019-04-16 NOTE — Assessment & Plan Note (Signed)
04/30/2017 Right lumpectomy: IDC grade 1, 1.4 cm, DCIS grade 1, margins negative, N0 stage IA, ER 90%, PR 90%, HER-2 negative, Ki-67 15% Oncotype DX score 16, low risk, 10% ROR Adjuvant radiation 05/31/2017 completed 07/12/2017  Current treatment: Antiestrogen therapy with tamoxifen 20 mg daily started 07/17/2017 with a plan to switch to anastrozole or letrozole once she is postmenopausal  Tamoxifen toxicities: Denies any side effects to tamoxifen therapy.  Denies any hot flashes or myalgias.  Breast cancer surveillance: 1.  Breast exam 04/18/2018: Benign 2. Mammogram 03/21/2019: No mammographic evidence of malignancy, breast density category B  Return to clinic in 1 year for follow-up

## 2019-04-23 ENCOUNTER — Telehealth: Payer: Self-pay | Admitting: Hematology and Oncology

## 2019-04-23 NOTE — Telephone Encounter (Signed)
Contacted patient to verify telephone visit for pre reg °

## 2019-04-23 NOTE — Progress Notes (Signed)
HEMATOLOGY-ONCOLOGY TELEPHONE VISIT PROGRESS NOTE  I connected with Brandi Lewis on 04/24/2019 at  8:45 AM EDT by telephone and verified that I am speaking with the correct person using two identifiers.  I discussed the limitations, risks, security and privacy concerns of performing an evaluation and management service by telephone and the availability of in person appointments.  I also discussed with the patient that there may be a patient responsible charge related to this service. The patient expressed understanding and agreed to proceed.   History of Present Illness: Brandi Lewis is a 53 y.o. female with above-mentioned history of right breast cancer treated with right lumpectomy, radiation, and who is currently on tamoxifen. I last saw her a year ago. Her most recent mammogram on 03/21/2019 revealed no evidence of malignancy bilaterally. She is over the phone today for annual follow-up.   Oncology History  Malignant neoplasm of upper-outer quadrant of right breast in female, estrogen receptor positive ([place])  03/26/2017 Initial Diagnosis   Screening detected right breast breast density 1.4 cm, axilla negative, biopsy: Grade 2 IDC with DCIS ER 90%, PR 90%, HER-2 negative ratio 1.05, Ki-67 15%, T1c N0 stage IA clinical stage   04/30/2017 Surgery   Right lumpectomy: IDC grade 1, 1.4 cm, DCIS grade 1, margins negative, N0 stage IA, ER 90%, PR 90%, HER-2 negative, Ki-67 15%   05/04/2017 Oncotype testing   Oncotype score 16, low risk, 10% ROR   05/31/2017 - 07/13/2017 Radiation Therapy   Adjuvant radiation therapy   07/2017 -  Anti-estrogen oral therapy   Tamoxifen daily     Observations/Objective:  No clinical signs or symptoms of breast cancer recurrence   Assessment Plan:  Malignant neoplasm of upper-outer quadrant of right breast in female, estrogen receptor positive ([place]) 04/30/2017 Right lumpectomy: IDC grade 1, 1.4 cm, DCIS grade 1, margins negative, N0 stage IA, ER 90%,  PR 90%, HER-2 negative, Ki-67 15% Oncotype DX score 16, low risk, 10% ROR Adjuvant radiation 05/31/2017 completed 07/12/2017  Current treatment: Antiestrogen therapy with tamoxifen 20 mg daily started 07/17/2017 with a plan to switch to anastrozole once she is postmenopausal Since the patient is not menopausal, recommended switching her to anastrozole.  Anastrozole counseling: We discussed the risks and benefits of anti-estrogen therapy with aromatase inhibitors. These include but not limited to insomnia, hot flashes, mood changes, vaginal dryness, bone density loss, and weight gain. We strongly believe that the benefits far outweigh the risks. Patient understands these risks and consented to starting treatment. Planned treatment duration is 5 years.  I sent a new prescription for anastrozole for 30 days.  If she tolerates it well then she will call us back so we can renew it for 90 days.  Breast cancer surveillance: 1.  Breast exam 04/18/2018: Benign 2. Mammogram 03/21/2019: No mammographic evidence of malignancy, breast density category B  I will perform a telephone visit in 3 months to check on how she is tolerating anastrozole. Patient expressed significant concerns about the cost of her care including the facility fee that the hospital charges.  I assured her that they would not be such fees when we do these telephone visits. Her husband is at [facility] currently undergoing surgery for prostate cancer.    I discussed the assessment and treatment plan with the patient. The patient was provided an opportunity to ask questions and all were answered. The patient agreed with the plan and demonstrated an understanding of the instructions. The patient was advised to call back  or seek an in-person evaluation if the symptoms worsen or if the condition fails to improve as anticipated.   I provided 15 minutes of non-face-to-face time during this encounter.   Rulon Eisenmenger, MD 04/24/2019    I,  Molly Dorshimer, am acting as scribe for Nicholas Lose, MD.  I have reviewed the above documentation for accuracy and completeness, and I agree with the above.

## 2019-04-24 ENCOUNTER — Inpatient Hospital Stay: Payer: PRIVATE HEALTH INSURANCE | Attending: Hematology and Oncology | Admitting: Hematology and Oncology

## 2019-04-24 DIAGNOSIS — Z79810 Long term (current) use of selective estrogen receptor modulators (SERMs): Secondary | ICD-10-CM

## 2019-04-24 DIAGNOSIS — Z170 Estrogen receptor positive status [ER+]: Secondary | ICD-10-CM | POA: Diagnosis not present

## 2019-04-24 DIAGNOSIS — Z923 Personal history of irradiation: Secondary | ICD-10-CM

## 2019-04-24 DIAGNOSIS — C50411 Malignant neoplasm of upper-outer quadrant of right female breast: Secondary | ICD-10-CM | POA: Diagnosis not present

## 2019-04-24 MED ORDER — ANASTROZOLE 1 MG PO TABS: 1.0000 mg | ORAL_TABLET | Freq: Every day | ORAL | 0 refills | Status: DC

## 2019-04-25 ENCOUNTER — Telehealth: Payer: Self-pay | Admitting: Hematology and Oncology

## 2019-04-25 NOTE — Telephone Encounter (Signed)
I talk with patient regarding schedule  

## 2019-05-21 ENCOUNTER — Other Ambulatory Visit: Payer: Self-pay | Admitting: Hematology and Oncology

## 2019-05-26 ENCOUNTER — Other Ambulatory Visit: Payer: Self-pay | Admitting: *Deleted

## 2019-05-26 MED ORDER — ANASTROZOLE 1 MG PO TABS: ORAL_TABLET | ORAL | 0 refills | Status: DC

## 2019-05-26 NOTE — Telephone Encounter (Signed)
Received call from pt stating she has been on Anastrozole for one month and is tolerating it fairly well.  Pt states she is experiencing fatigue but is unsure if it related to the medication.  Pt denies any bone/joint pain.  RN educated pt to take medication at night for another month and to update the office on her symptoms.  Pt verbalized understanding.

## 2019-06-11 DIAGNOSIS — M65312 Trigger thumb, left thumb: Secondary | ICD-10-CM | POA: Insufficient documentation

## 2019-06-25 ENCOUNTER — Encounter: Payer: Self-pay | Admitting: Podiatry

## 2019-06-25 ENCOUNTER — Ambulatory Visit: Payer: PRIVATE HEALTH INSURANCE | Admitting: Podiatry

## 2019-06-25 ENCOUNTER — Other Ambulatory Visit: Payer: Self-pay

## 2019-06-25 VITALS — Temp 98.4°F

## 2019-06-25 DIAGNOSIS — L600 Ingrowing nail: Secondary | ICD-10-CM | POA: Diagnosis not present

## 2019-06-25 NOTE — Progress Notes (Signed)
Subjective:   Patient ID: Brandi Lewis, female   DOB: 53 y.o.   MRN: [id number]   HPI Patient presents stating that the right hallux nails been painful around 3 weeks and she does not remember any injury   ROS      Objective:  Physical Exam  Neurovascular status intact negative Homans sign noted with patient's right hallux nail lateral side showing discoloration but no active drainage or other pathology     Assessment:  Ability for ingrown toenail with trauma to the right hallux     Plan:  Reviewed condition and recommended soaks therapy along with cushion and this should heal uneventfully and patient will be seen back as needed

## 2019-07-04 ENCOUNTER — Telehealth: Payer: Self-pay

## 2019-07-04 NOTE — Telephone Encounter (Signed)
RN returned call, voicemail left for call back.  

## 2019-07-04 NOTE — Telephone Encounter (Signed)
RN attempted return calls X 3.  Voicemail left for return call.

## 2019-07-08 ENCOUNTER — Telehealth: Payer: Self-pay | Admitting: *Deleted

## 2019-07-08 NOTE — Telephone Encounter (Signed)
Received VM from pt, attempt x1 to return call, no answer, LVM

## 2019-07-08 NOTE — Telephone Encounter (Signed)
Received call from pt stating she switched from Tamoxifen to Anastrozole in July.  Pt states she is now experiencing right hand cramping and pain, unable to make a fist.  Pt also states she is experiencing generalized body and joint pain.   Pt states she had no side effects while on Tamoxifen and is wanting to possibly switch back.  RN educated pt to stop taking Anastrozole x2 weeks to see if symptoms resolve.  Pt will call our office with an update and at that time we will review with Dr. Lindi Adie and decide the best course of action.  Pt verbalized understanding.

## 2019-07-09 ENCOUNTER — Other Ambulatory Visit: Payer: Self-pay | Admitting: Obstetrics and Gynecology

## 2019-07-09 DIAGNOSIS — N644 Mastodynia: Secondary | ICD-10-CM

## 2019-07-11 ENCOUNTER — Ambulatory Visit
Admission: RE | Admit: 2019-07-11 | Discharge: 2019-07-11 | Disposition: A | Discharge status: Home / Self Care | Payer: PRIVATE HEALTH INSURANCE | Source: Ambulatory Visit | Attending: Obstetrics and Gynecology | Admitting: Obstetrics and Gynecology

## 2019-07-11 ENCOUNTER — Other Ambulatory Visit: Payer: Self-pay

## 2019-07-11 ENCOUNTER — Ambulatory Visit: Payer: PRIVATE HEALTH INSURANCE

## 2019-07-11 DIAGNOSIS — N644 Mastodynia: Secondary | ICD-10-CM

## 2019-07-23 ENCOUNTER — Telehealth: Payer: Self-pay | Admitting: *Deleted

## 2019-07-23 ENCOUNTER — Other Ambulatory Visit: Payer: Self-pay | Admitting: *Deleted

## 2019-07-23 MED ORDER — LETROZOLE 2.5 MG PO TABS: 2.5000 mg | ORAL_TABLET | Freq: Every day | ORAL | 0 refills | Status: DC

## 2019-07-23 NOTE — Telephone Encounter (Signed)
Received call from pt stating she has stopped taking anastrozole x2 weeks and symptoms of hand cramping and generalized joint pain has resolved.  Per Dr. Lindi Adie, pt to be switched to letrozole 2.5 mg tablet daily.  Pt educated on taking medication every other night x1 week and then continue to every night after.  Pt verbalized understanding and will call the office if she experiences any negative side effects.

## 2019-07-24 ENCOUNTER — Telehealth: Payer: Self-pay | Admitting: Hematology and Oncology

## 2019-07-24 NOTE — Telephone Encounter (Signed)
Scheduled appt per 9/9 sch message- mailed appt for June 2021 f/u

## 2019-07-25 ENCOUNTER — Ambulatory Visit: Payer: PRIVATE HEALTH INSURANCE | Admitting: Hematology and Oncology

## 2019-08-26 ENCOUNTER — Other Ambulatory Visit: Payer: Self-pay | Admitting: Hematology and Oncology

## 2019-09-17 ENCOUNTER — Telehealth: Payer: Self-pay

## 2019-09-17 NOTE — Telephone Encounter (Signed)
RN returned call, left voicemail for call back.

## 2019-11-24 ENCOUNTER — Other Ambulatory Visit: Payer: Self-pay | Admitting: Family Medicine

## 2019-11-24 DIAGNOSIS — R110 Nausea: Secondary | ICD-10-CM

## 2019-11-25 ENCOUNTER — Other Ambulatory Visit: Payer: Self-pay | Admitting: Hematology and Oncology

## 2019-12-03 ENCOUNTER — Ambulatory Visit
Admission: RE | Admit: 2019-12-03 | Discharge: 2019-12-03 | Disposition: A | Discharge status: Home / Self Care | Payer: PRIVATE HEALTH INSURANCE | Source: Ambulatory Visit | Attending: Family Medicine | Admitting: Family Medicine

## 2019-12-03 DIAGNOSIS — R110 Nausea: Secondary | ICD-10-CM

## 2020-02-11 ENCOUNTER — Telehealth: Payer: Self-pay | Admitting: *Deleted

## 2020-02-11 NOTE — Telephone Encounter (Signed)
Received call from pt with complaint of weight gain and is wanting to know if it is related to the letrozole.  Pt states she exercises daily but has not been watching her diet.  Per MD weight gain may be contributed to poor nutrition diet.  Pt advised to follow a low carb/sugar diet and exercise for one month to see if symptoms improve.  Pt verbalized understanding.

## 2020-03-02 ENCOUNTER — Telehealth: Payer: Self-pay

## 2020-03-02 NOTE — Telephone Encounter (Signed)
RN returned call, voicemail left for return call.  

## 2020-03-03 ENCOUNTER — Telehealth: Payer: Self-pay

## 2020-03-03 NOTE — Telephone Encounter (Signed)
RN encouraged patient to continue with exercise, portion control and low carb dieting.  MD would like patient to continue Letrozole due to minimal side effects reported.    RN encouraged use of probiotic, along with previous recommendations.  Pt verbalized understanding.

## 2020-03-03 NOTE — Telephone Encounter (Signed)
Patient called to update on status of recent recommendations of diet/exercise.  Per pt no improvement with change in diet, and increase in exercise.    Pt reports an average of 13lb weight gain since she started Letrozole.  Pt tolerating medication well, however feels weight gain might be related.

## 2020-04-15 ENCOUNTER — Telehealth: Payer: Self-pay | Admitting: Hematology and Oncology

## 2020-04-15 NOTE — Telephone Encounter (Signed)
R/s 6/15 appt per [facility] - unable to reach pt . Left message with appt date and time

## 2020-04-27 ENCOUNTER — Ambulatory Visit: Payer: PRIVATE HEALTH INSURANCE | Admitting: Hematology and Oncology

## 2020-05-06 ENCOUNTER — Ambulatory Visit: Payer: PRIVATE HEALTH INSURANCE | Admitting: Hematology and Oncology

## 2020-05-19 NOTE — Progress Notes (Signed)
Patient Care Team: [name], MD as PCP - General (Family Medicine) Servando Salina, MD as Consulting Physician (Obstetrics and Gynecology) Erroll Luna, MD as Consulting Physician (General Surgery) Nicholas Lose, MD as Consulting Physician (Hematology and Oncology) Kyung Rudd, MD as Consulting Physician (Radiation Oncology) Gardenia Phlegm, NP as Nurse Practitioner (Hematology and Oncology)  DIAGNOSIS:    ICD-10-CM   1. Malignant neoplasm of upper-outer quadrant of right breast in female, estrogen receptor positive ([place])  C50.411    Z17.0     SUMMARY OF ONCOLOGIC HISTORY: Oncology History  Malignant neoplasm of upper-outer quadrant of right breast in female, estrogen receptor positive ([place])  03/26/2017 Initial Diagnosis   Screening detected right breast breast density 1.4 cm, axilla negative, biopsy: Grade 2 IDC with DCIS ER 90%, PR 90%, HER-2 negative ratio 1.05, Ki-67 15%, T1c N0 stage IA clinical stage   04/30/2017 Surgery   Right lumpectomy: IDC grade 1, 1.4 cm, DCIS grade 1, margins negative, N0 stage IA, ER 90%, PR 90%, HER-2 negative, Ki-67 15%   05/04/2017 Oncotype testing   Oncotype score 16, low risk, 10% ROR   05/31/2017 - 07/13/2017 Radiation Therapy   Adjuvant radiation therapy   07/2017 -  Anti-estrogen oral therapy   Tamoxifen daily, switched to anastrozole after entering menopause on 04/24/19, switched to letrozole 07/23/19 due to hand cramping and joint pain while on anastrozole     CHIEF COMPLIANT: Follow-up of right breast cancer on letrozole  INTERVAL HISTORY: Brandi Lewis is a 54 y.o. with above-mentioned history of right breast cancer treated with right lumpectomy, radiation, and who is currently on letrozole. Mammogram on 07/11/2019 revealed no evidence of malignancy in the right breast. She presents to the clinic today for annual follow-up.    ALLERGIES:  is allergic to codeine.  MEDICATIONS:  Current Outpatient Medications    Medication Sig Dispense Refill  . cholecalciferol (VITAMIN D) 1000 units tablet Take 1,000 Units by mouth at bedtime.    . docusate sodium (COLACE) 100 MG capsule Take 100 mg by mouth at bedtime.     Marland Kitchen letrozole (FEMARA) 2.5 MG tablet TAKE 1 TABLET(2.5 MG) BY MOUTH DAILY 90 tablet 1  . meclizine (ANTIVERT) 25 MG tablet Take 1 tablet (25 mg total) by mouth 3 (three) times daily as needed for dizziness. 15 tablet 0  . promethazine (PHENERGAN) 25 MG tablet Take 25 mg every 6 (six) hours as needed by mouth for nausea or vomiting.    . tamoxifen (NOLVADEX) 20 MG tablet TAKE 1 TABLET(20 MG) BY MOUTH AT BEDTIME 90 tablet 0  . tiZANidine (ZANAFLEX) 4 MG capsule Take 4 mg daily as needed by mouth for muscle spasms.     . Topiramate ER (QUDEXY XR) 200 MG CS24 sprinkle capsule Take 200 mg by mouth at bedtime.     Marland Kitchen Ubrogepant (UBRELVY) 50 MG TABS Ubrelvy 50 mg tablet  TAKE 1 TABLET BY MOUTH AS NEEDED FOR MIGRAINE. MAY REPEAT DOSE AFTER 2 HOURS     No current facility-administered medications for this visit.    PHYSICAL EXAMINATION: ECOG PERFORMANCE STATUS: 1 - Symptomatic but completely ambulatory  Vitals:   05/20/20 0840  BP: 135/78  Pulse: 86  Resp: 18  Temp: 98.7 F (37.1 C)  SpO2: 100%   Filed Weights   05/20/20 0840  Weight: 124 lb (56.2 kg)    BREAST: No palpable masses or nodules in either right or left breasts. No palpable axillary supraclavicular or infraclavicular adenopathy no breast tenderness  or nipple discharge. (exam performed in the presence of a chaperone)  LABORATORY DATA:  I have reviewed the data as listed CMP Latest Ref Rng & Units 05/22/2018 04/04/2017 09/27/2016  Glucose 70 - 99 mg/dL 125(H) 100 87  BUN 6 - 20 mg/dL 14 10.2 11  Creatinine 0.44 - 1.00 mg/dL 0.80 1.1 1.10(H)  Sodium 135 - 145 mmol/L 141 143 142  Potassium 3.5 - 5.1 mmol/L 3.4(L) 4.1 4.4  Chloride 98 - 111 mmol/L 107 - 112(H)  CO2 22 - 29 mEq/L - 21(L) -  Calcium 8.4 - 10.4 mg/dL - 9.5 -  Total  Protein 6.4 - 8.3 g/dL - 7.5 -  Total Bilirubin 0.20 - 1.20 mg/dL - 0.47 -  Alkaline Phos 40 - 150 U/L - 70 -  AST 5 - 34 U/L - 31 -  ALT 0 - 55 U/L - 34 -    Lab Results  Component Value Date   WBC 4.9 09/21/2017   HGB 15.3 (H) 05/22/2018   HCT 45.0 05/22/2018   MCV 87.9 09/21/2017   PLT 162 09/21/2017   NEUTROABS 1.9 04/04/2017    ASSESSMENT & PLAN:  Malignant neoplasm of upper-outer quadrant of right breast in female, estrogen receptor positive ([place]) 04/30/2017 Right lumpectomy: IDC grade 1, 1.4 cm, DCIS grade 1, margins negative, N0 stage IA, ER 90%, PR 90%, HER-2 negative, Ki-67 15% Oncotype DX score 16, low risk, 10% ROR Adjuvant radiation 05/31/2017 completed 07/12/2017  Current treatment: Antiestrogen therapy with tamoxifen 20 mg daily started 07/17/2017 switch to anastrozole 04/24/2019 switched to letrozole 07/23/2019  Letrozole toxicities: Hair thinning She is tolerating letrozole much better than anastrozole. She no longer has joint aches or stiffness.  Breast cancer surveillance: 1.Breast exam 04/18/2018: Benign, breast tenderness right breast at the lumpectomy site probably from scar tissue 2.Mammogram 03/21/2019: No mammographic evidence of malignancy, breast density category B Right breast mammogram 07/11/2019: No evidence of malignancy postsurgical changes.  No orders of the defined types were placed in this encounter.  The patient has a good understanding of the overall plan. she agrees with it. she will call with any problems that may develop before the next visit here.  Total time spent: 20 mins including face to face time and time spent for planning, charting and coordination of care  Nicholas Lose, MD 05/20/2020  I, Cloyde Reams Dorshimer, am acting as scribe for Dr. Nicholas Lose.  I have reviewed the above documentation for accuracy and completeness, and I agree with the above.

## 2020-05-19 NOTE — Assessment & Plan Note (Signed)
04/30/2017 Right lumpectomy: IDC grade 1, 1.4 cm, DCIS grade 1, margins negative, N0 stage IA, ER 90%, PR 90%, HER-2 negative, Ki-67 15% Oncotype DX score 16, low risk, 10% ROR Adjuvant radiation 05/31/2017 completed 07/12/2017  Current treatment: Antiestrogen therapy with tamoxifen 20 mg daily started 07/17/2017 switch to anastrozole 04/24/2019  Anastrozole toxicities:  Breast cancer surveillance: 1.Breast exam 04/18/2018: Benign 2.Mammogram 03/21/2019: No mammographic evidence of malignancy, breast density category B Right breast mammogram 07/11/2019: No evidence of malignancy postsurgical changes.

## 2020-05-20 ENCOUNTER — Inpatient Hospital Stay: Payer: PRIVATE HEALTH INSURANCE | Attending: Hematology and Oncology | Admitting: Hematology and Oncology

## 2020-05-20 ENCOUNTER — Other Ambulatory Visit: Payer: Self-pay

## 2020-05-20 DIAGNOSIS — Z79899 Other long term (current) drug therapy: Secondary | ICD-10-CM | POA: Insufficient documentation

## 2020-05-20 DIAGNOSIS — Z170 Estrogen receptor positive status [ER+]: Secondary | ICD-10-CM | POA: Insufficient documentation

## 2020-05-20 DIAGNOSIS — Z79811 Long term (current) use of aromatase inhibitors: Secondary | ICD-10-CM | POA: Insufficient documentation

## 2020-05-20 DIAGNOSIS — C50411 Malignant neoplasm of upper-outer quadrant of right female breast: Secondary | ICD-10-CM | POA: Insufficient documentation

## 2020-05-20 DIAGNOSIS — Z923 Personal history of irradiation: Secondary | ICD-10-CM | POA: Diagnosis not present

## 2020-06-10 ENCOUNTER — Other Ambulatory Visit: Payer: Self-pay | Admitting: Hematology and Oncology

## 2020-07-28 ENCOUNTER — Other Ambulatory Visit: Payer: Self-pay | Admitting: Family Medicine

## 2020-07-28 DIAGNOSIS — Z1231 Encounter for screening mammogram for malignant neoplasm of breast: Secondary | ICD-10-CM

## 2020-08-05 ENCOUNTER — Other Ambulatory Visit: Payer: Self-pay | Admitting: Family Medicine

## 2020-08-05 ENCOUNTER — Other Ambulatory Visit: Payer: Self-pay

## 2020-08-05 ENCOUNTER — Ambulatory Visit
Admission: RE | Admit: 2020-08-05 | Discharge: 2020-08-05 | Disposition: A | Discharge status: Home / Self Care | Payer: PRIVATE HEALTH INSURANCE | Source: Ambulatory Visit | Attending: Family Medicine | Admitting: Family Medicine

## 2020-08-05 DIAGNOSIS — Z853 Personal history of malignant neoplasm of breast: Secondary | ICD-10-CM

## 2020-08-05 DIAGNOSIS — Z1231 Encounter for screening mammogram for malignant neoplasm of breast: Secondary | ICD-10-CM

## 2020-12-16 ENCOUNTER — Other Ambulatory Visit: Payer: Self-pay | Admitting: Hematology and Oncology

## 2021-05-20 ENCOUNTER — Inpatient Hospital Stay: Payer: No Typology Code available for payment source | Admitting: Hematology and Oncology

## 2021-05-20 ENCOUNTER — Telehealth: Payer: Self-pay | Admitting: Hematology and Oncology

## 2021-05-20 NOTE — Assessment & Plan Note (Deleted)
04/30/2017 Right lumpectomy: IDC grade 1, 1.4 cm, DCIS grade 1, margins negative, N0 stage IA, ER 90%, PR 90%, HER-2 negative, Ki-67 15% Oncotype DX score 16, low risk, 10% ROR Adjuvant radiation 05/31/2017 completed 07/12/2017  Current treatment: Antiestrogen therapy with tamoxifen 20 mg daily started 07/17/2017 switch to anastrozole 04/24/2019 switched to letrozole 07/23/2019  Letrozole toxicities: Hair thinning She is tolerating letrozole much better than anastrozole. She no longer has joint aches or stiffness.  Breast cancer surveillance: 1.Breast exam 05/20/2021: Benign, breast tenderness right breast at the lumpectomy site probably from scar tissue 2.Mammogram9/23/2021: No mammographic evidence of malignancy, breast density categoryB  Return to clinic in 1 year for follow-up

## 2021-05-20 NOTE — Telephone Encounter (Signed)
Scheduled appointment per 07/08 los. Patient is aware.

## 2021-05-20 NOTE — Telephone Encounter (Signed)
R/s appts per 7/8 sch msg. Pt aware.  

## 2021-06-02 NOTE — Progress Notes (Signed)
Patient Care Team: [name], MD as PCP - General (Family Medicine) Servando Salina, MD as Consulting Physician (Obstetrics and Gynecology) Erroll Luna, MD as Consulting Physician (General Surgery) Nicholas Lose, MD as Consulting Physician (Hematology and Oncology) Kyung Rudd, MD as Consulting Physician (Radiation Oncology) Gardenia Phlegm, NP as Nurse Practitioner (Hematology and Oncology)  DIAGNOSIS:    ICD-10-CM   1. Malignant neoplasm of upper-outer quadrant of right breast in female, estrogen receptor positive ([place])  C50.411    Z17.0       SUMMARY OF ONCOLOGIC HISTORY: Oncology History  Malignant neoplasm of upper-outer quadrant of right breast in female, estrogen receptor positive ([place])  03/26/2017 Initial Diagnosis   Screening detected right breast breast density 1.4 cm, axilla negative, biopsy: Grade 2 IDC with DCIS ER 90%, PR 90%, HER-2 negative ratio 1.05, Ki-67 15%, T1c N0 stage IA clinical stage    04/30/2017 Surgery   Right lumpectomy: IDC grade 1, 1.4 cm, DCIS grade 1, margins negative, N0 stage IA, ER 90%, PR 90%, HER-2 negative, Ki-67 15%    05/04/2017 Oncotype testing   Oncotype score 16, low risk, 10% ROR    05/31/2017 - 07/13/2017 Radiation Therapy   Adjuvant radiation therapy    07/2017 -  Anti-estrogen oral therapy   Tamoxifen daily, switched to anastrozole after entering menopause on 04/24/19, switched to letrozole 07/23/19 due to hand cramping and joint pain while on anastrozole     CHIEF COMPLIANT: Follow-up of right breast cancer on letrozole  INTERVAL HISTORY: Brandi Lewis is a 55 y.o. with above-mentioned history of right breast cancer treated with right lumpectomy, radiation, and who is currently on letrozole. Mammogram on 08/05/20 revealed no evidence of malignancy in the right breast. She presents to the clinic today for annual follow-up.  She is experiencing tremendous amount of hot flashes which are bothering her at  work.  ALLERGIES:  is allergic to codeine.  MEDICATIONS:  Current Outpatient Medications  Medication Sig Dispense Refill   cholecalciferol (VITAMIN D) 1000 units tablet Take 1,000 Units by mouth at bedtime.     letrozole (FEMARA) 2.5 MG tablet TAKE 1 TABLET(2.5 MG) BY MOUTH DAILY 90 tablet 1   tiZANidine (ZANAFLEX) 4 MG capsule Take 4 mg daily as needed by mouth for muscle spasms.      Topiramate ER (QUDEXY XR) 200 MG CS24 sprinkle capsule Take 200 mg by mouth at bedtime.      Ubrogepant (UBRELVY) 50 MG TABS Ubrelvy 50 mg tablet  TAKE 1 TABLET BY MOUTH AS NEEDED FOR MIGRAINE. MAY REPEAT DOSE AFTER 2 HOURS     No current facility-administered medications for this visit.    PHYSICAL EXAMINATION: ECOG PERFORMANCE STATUS: 1 - Symptomatic but completely ambulatory  Vitals:   06/03/21 1427  BP: 125/72  Pulse: 90  Resp: 18  Temp: 97.8 F (36.6 C)  SpO2: 100%   Filed Weights   06/03/21 1427  Weight: 120 lb 1 oz (54.5 kg)    BREAST: No palpable masses or nodules in either right or left breasts. No palpable axillary supraclavicular or infraclavicular adenopathy no breast tenderness or nipple discharge. (exam performed in the presence of a chaperone)  LABORATORY DATA:  I have reviewed the data as listed CMP Latest Ref Rng & Units 05/22/2018 04/04/2017 09/27/2016  Glucose 70 - 99 mg/dL 125(H) 100 87  BUN 6 - 20 mg/dL 14 10.2 11  Creatinine 0.44 - 1.00 mg/dL 0.80 1.1 1.10(H)  Sodium 135 - 145 mmol/L 141 143  142  Potassium 3.5 - 5.1 mmol/L 3.4(L) 4.1 4.4  Chloride 98 - 111 mmol/L 107 - 112(H)  CO2 22 - 29 mEq/L - 21(L) -  Calcium 8.4 - 10.4 mg/dL - 9.5 -  Total Protein 6.4 - 8.3 g/dL - 7.5 -  Total Bilirubin 0.20 - 1.20 mg/dL - 0.47 -  Alkaline Phos 40 - 150 U/L - 70 -  AST 5 - 34 U/L - 31 -  ALT 0 - 55 U/L - 34 -    Lab Results  Component Value Date   WBC 4.9 09/21/2017   HGB 15.3 (H) 05/22/2018   HCT 45.0 05/22/2018   MCV 87.9 09/21/2017   PLT 162 09/21/2017   NEUTROABS  1.9 04/04/2017    ASSESSMENT & PLAN:  Malignant neoplasm of upper-outer quadrant of right breast in female, estrogen receptor positive ([place]) 04/30/2017 Right lumpectomy: IDC grade 1, 1.4 cm, DCIS grade 1, margins negative, N0 stage IA, ER 90%, PR 90%, HER-2 negative, Ki-67 15% Oncotype DX score 16, low risk, 10% ROR Adjuvant radiation 05/31/2017 completed 07/12/2017   Current treatment: Antiestrogen therapy with tamoxifen 20 mg daily started 07/17/2017 switch to anastrozole 04/24/2019 switched to letrozole 07/23/2019   Letrozole toxicities: Severe hot flashes I recommended holding letrozole for 2 weeks and then start taking it in the morning.  If she persists to have hot flashes then we can switch her to exemestane.  Otherwise we can send a prescription for Effexor.  She will call us in a month to update her status.   Breast cancer surveillance: 1.  Breast exam 06/03/2021: Benign, breast tenderness right breast at the lumpectomy site probably from scar tissue 2. Mammogram 08/05/2020: No mammographic evidence of malignancy, breast density category B  Return to clinic in 1 year for follow-up or sooner if needed    No orders of the defined types were placed in this encounter.  The patient has a good understanding of the overall plan. she agrees with it. she will call with any problems that may develop before the next visit here.  Total time spent: 30 mins including face to face time and time spent for planning, charting and coordination of care  Rulon Eisenmenger, MD, MPH 06/03/2021  I, Thana Ates, am acting as scribe for Dr. Nicholas Lose.  I have reviewed the above documentation for accuracy and completeness, and I agree with the above.

## 2021-06-03 ENCOUNTER — Inpatient Hospital Stay
Payer: No Typology Code available for payment source | Attending: Hematology and Oncology | Admitting: Hematology and Oncology

## 2021-06-03 ENCOUNTER — Other Ambulatory Visit: Payer: Self-pay

## 2021-06-03 DIAGNOSIS — Z170 Estrogen receptor positive status [ER+]: Secondary | ICD-10-CM

## 2021-06-03 DIAGNOSIS — Z79811 Long term (current) use of aromatase inhibitors: Secondary | ICD-10-CM | POA: Insufficient documentation

## 2021-06-03 DIAGNOSIS — Z923 Personal history of irradiation: Secondary | ICD-10-CM | POA: Insufficient documentation

## 2021-06-03 DIAGNOSIS — C50411 Malignant neoplasm of upper-outer quadrant of right female breast: Secondary | ICD-10-CM | POA: Diagnosis present

## 2021-06-03 NOTE — Assessment & Plan Note (Signed)
04/30/2017 Right lumpectomy: IDC grade 1, 1.4 cm, DCIS grade 1, margins negative, N0 stage IA, ER 90%, PR 90%, HER-2 negative, Ki-67 15% Oncotype DX score 16, low risk, 10% ROR Adjuvant radiation 05/31/2017 completed 07/12/2017  Current treatment: Antiestrogen therapy with tamoxifen 20 mg daily started 07/17/2017 switch to anastrozole 04/24/2019 switched to letrozole 07/23/2019  Letrozole toxicities: Hair thinning She is tolerating letrozole much better than anastrozole. She no longer has joint aches or stiffness.  Breast cancer surveillance: 1.Breast exam 06/03/2021: Benign, breast tenderness right breast at the lumpectomy site probably from scar tissue 2.Mammogram9/23/2021: No mammographic evidence of malignancy, breast density categoryB  Return to clinic in 1 year for follow-up

## 2021-06-14 ENCOUNTER — Other Ambulatory Visit: Payer: Self-pay | Admitting: Hematology and Oncology

## 2021-06-21 ENCOUNTER — Telehealth: Payer: Self-pay | Admitting: *Deleted

## 2021-06-21 NOTE — Telephone Encounter (Signed)
Brandi Lewis states she has been off the letrozole for 2 weeks and is still having hot flashes. Please advise

## 2021-06-23 ENCOUNTER — Telehealth: Payer: Self-pay | Admitting: *Deleted

## 2021-06-23 NOTE — Telephone Encounter (Signed)
Notified to stay off Letrozole for 2 more weeks and then call us.

## 2021-07-26 ENCOUNTER — Telehealth: Payer: Self-pay | Admitting: *Deleted

## 2021-07-26 ENCOUNTER — Telehealth: Payer: Self-pay | Admitting: Hematology and Oncology

## 2021-07-26 NOTE — Telephone Encounter (Signed)
Received call from pt stating she has stopped letrozole x1 month and is continuing to experience hot flashes.  Pt states hot flashes are not as severe as they were while on letrozole.  Per MD pt to resume letrozole every other night x 1 week and then increase to every night.  Pt to also follow up in office in 1 month to evaluate side effects and tx options. Pt educated and verbalized understanding.

## 2021-07-26 NOTE — Telephone Encounter (Signed)
Scheduled per sch msg. Called and left msg  

## 2021-08-22 ENCOUNTER — Other Ambulatory Visit: Payer: Self-pay | Admitting: Family Medicine

## 2021-08-22 DIAGNOSIS — Z9889 Other specified postprocedural states: Secondary | ICD-10-CM

## 2021-08-24 ENCOUNTER — Telehealth: Payer: Self-pay | Admitting: *Deleted

## 2021-08-24 NOTE — Telephone Encounter (Signed)
Pt called to clarify if appt for 10/13 was needed. Discussed with pt what was noted in her chart about increasing hot flashes and treatment options being discussed with Dr.Gudena. Pt stated labs were requested to be sent to cancer center from GYN doctor. Pt stated test was performed that confirmed that she was in menopause and the reason for hot flash symptoms. Per Dr.Gudena, advised pt if hot flashes increased again to flow guidelines from before. Pt verbalized understanding.

## 2021-08-25 ENCOUNTER — Inpatient Hospital Stay: Payer: No Typology Code available for payment source | Admitting: Hematology and Oncology

## 2021-08-27 ENCOUNTER — Ambulatory Visit
Admission: RE | Admit: 2021-08-27 | Discharge: 2021-08-27 | Disposition: A | Discharge status: Home / Self Care | Payer: No Typology Code available for payment source | Source: Ambulatory Visit | Attending: Family Medicine | Admitting: Family Medicine

## 2021-08-27 ENCOUNTER — Other Ambulatory Visit: Payer: Self-pay | Admitting: Family Medicine

## 2021-08-27 DIAGNOSIS — Z9889 Other specified postprocedural states: Secondary | ICD-10-CM

## 2021-08-27 DIAGNOSIS — Z1231 Encounter for screening mammogram for malignant neoplasm of breast: Secondary | ICD-10-CM

## 2021-09-02 ENCOUNTER — Other Ambulatory Visit: Payer: Self-pay | Admitting: Family Medicine

## 2021-09-02 DIAGNOSIS — R928 Other abnormal and inconclusive findings on diagnostic imaging of breast: Secondary | ICD-10-CM

## 2021-09-20 ENCOUNTER — Other Ambulatory Visit: Payer: No Typology Code available for payment source

## 2021-10-04 ENCOUNTER — Other Ambulatory Visit: Payer: Self-pay | Admitting: Family Medicine

## 2021-10-04 ENCOUNTER — Ambulatory Visit
Admission: RE | Admit: 2021-10-04 | Discharge: 2021-10-04 | Disposition: A | Discharge status: Home / Self Care | Payer: No Typology Code available for payment source | Source: Ambulatory Visit | Attending: Family Medicine | Admitting: Family Medicine

## 2021-10-04 ENCOUNTER — Other Ambulatory Visit: Payer: No Typology Code available for payment source

## 2021-10-04 DIAGNOSIS — R928 Other abnormal and inconclusive findings on diagnostic imaging of breast: Secondary | ICD-10-CM

## 2021-10-04 DIAGNOSIS — N6489 Other specified disorders of breast: Secondary | ICD-10-CM

## 2021-10-13 ENCOUNTER — Other Ambulatory Visit: Payer: Self-pay | Admitting: Family Medicine

## 2021-10-13 ENCOUNTER — Ambulatory Visit
Admission: RE | Admit: 2021-10-13 | Discharge: 2021-10-13 | Disposition: A | Discharge status: Home / Self Care | Payer: No Typology Code available for payment source | Source: Ambulatory Visit | Attending: Family Medicine | Admitting: Family Medicine

## 2021-10-13 DIAGNOSIS — N6489 Other specified disorders of breast: Secondary | ICD-10-CM

## 2021-10-13 DIAGNOSIS — R928 Other abnormal and inconclusive findings on diagnostic imaging of breast: Secondary | ICD-10-CM

## 2022-02-02 ENCOUNTER — Telehealth: Payer: Self-pay

## 2022-02-02 NOTE — Progress Notes (Signed)
[facility] ?OFFICE PROGRESS NOTE ? ?[name], MD ?[address] ?[place] [address] ? ?DIAGNOSIS:  ? Malignant neoplasm of upper-outer quadrant of right breast in female, estrogen receptor positive   ? ? ?PRIOR THERAPY: ?Oncology History  ?Malignant neoplasm of upper-outer quadrant of right breast in female, estrogen receptor positive ([place])  ?03/26/2017 Initial Diagnosis  ? Screening detected right breast breast density 1.4 cm, axilla negative, biopsy: Grade 2 IDC with DCIS ER 90%, PR 90%, HER-2 negative ratio 1.05, Ki-67 15%, T1c N0 stage IA clinical stage ?  ?04/30/2017 Surgery  ? Right lumpectomy: IDC grade 1, 1.4 cm, DCIS grade 1, margins negative, N0 stage IA, ER 90%, PR 90%, HER-2 negative, Ki-67 15% ?  ?05/04/2017 Oncotype testing  ? Oncotype score 16, low risk, 10% ROR ?  ?05/31/2017 - 07/13/2017 Radiation Therapy  ? Adjuvant radiation therapy ?  ?07/2017 -  Anti-estrogen oral therapy  ? Tamoxifen daily, switched to anastrozole after entering menopause on 04/24/19, switched to letrozole 07/23/19 due to hand cramping and joint pain while on anastrozole ?  ? ? ? ?CURRENT THERAPY: letrozole ? ?INTERVAL HISTORY: ?Brandi Lewis 56 y.o. female returns to the clinic today for a symptom management appointment.  The patient is status post a right lobectomy and adjuvant radiation in 2018 for her stage Ia breast cancer.  She is currently on letrozole.  Starting 2 days ago, the patient has been having significant pain in the right axillary and right lateral breast.  She denies any recent strenuous activities, traumas, lifting heavy objects, shoveling, etc.  Denies any coughing spells or shortness of breath.  Denies any history of any spine or shoulder orthopedic complaints.  She characterizes her pain as a dull/sharp pain.  In general, the pain does not radiate.  She has not tried taking anything for the pain.  She denies any new overlying skin changes.  She has had some skin retraction since  her lumpectomy near the right nipple.  She denies any nipple discharge, swelling, erythema, masses, or rashes.  She is tender to minimal palpation.  Her pain is exacerbated by movement and elevating her arm as it pulls on the muscles under her arm.  However, the pain is constant even without activity.  Her last mammogram and ultrasound was performed on 10/04/2021.  Initially, it noted increased rounded region near the surgical clips on the right breast.  She then presented for repeat stereotactic biopsy of the right breast on 10/13/2021 but at the time of the procedure, the asymmetry initially described could not be reproduced despite multiple attempts.  It was noted that the patient had a large amount of skin dimpling/retraction at the site of previous surgical scar which may contribute to the asymmetry originally seen.  Therefore, the stereotactic biopsy was canceled and it was recommended that she return for short-term follow-up in 6 months.  This was scheduled for June 2023. ? ?MEDICAL HISTORY: ?Past Medical History:  ?Diagnosis Date  ? Allergy   ? codeine  ? Breast cancer ([place]) 03/26/2017 bx  ? right breast   ? H/O partial thyroidectomy 2006  ? nodule  ? Headache(784.0)   ? migraine  ? Personal history of radiation therapy 2018  ? Right Breast Cancer  ? Thrombosed hemorrhoids   ? ? ?ALLERGIES:  is allergic to codeine. ? ?MEDICATIONS:  ?Current Outpatient Medications  ?Medication Sig Dispense Refill  ? cholecalciferol (VITAMIN D) 1000 units tablet Take 1,000 Units by mouth at bedtime.    ?  letrozole (FEMARA) 2.5 MG tablet TAKE 1 TABLET(2.5 MG) BY MOUTH DAILY 90 tablet 1  ? tiZANidine (ZANAFLEX) 4 MG capsule Take 4 mg daily as needed by mouth for muscle spasms.     ? Topiramate ER (QUDEXY XR) 200 MG CS24 sprinkle capsule Take 200 mg by mouth at bedtime.     ? Ubrogepant (UBRELVY) 50 MG TABS Ubrelvy 50 mg tablet ? TAKE 1 TABLET BY MOUTH AS NEEDED FOR MIGRAINE. MAY REPEAT DOSE AFTER 2 HOURS    ? ?No current  facility-administered medications for this visit.  ? ? ?SURGICAL HISTORY:  ?Past Surgical History:  ?Procedure Laterality Date  ? BREAST BIOPSY  12/08/2005  ? BREAST CYST EXCISION  2007  ? rt breast  ? BREAST EXCISIONAL BIOPSY Right 03/30/2010  ? BREAST EXCISIONAL BIOPSY Right 1984  ? BREAST LUMPECTOMY Right 2018  ? BREAST LUMPECTOMY WITH RADIOACTIVE SEED AND SENTINEL LYMPH NODE BIOPSY Right 04/30/2017  ? Procedure: BREAST LUMPECTOMY WITH RADIOACTIVE SEED AND AXILLARY SENTINEL LYMPH NODE BIOPSY;  Surgeon: Erroll Luna, MD;  Location: [facility];  Service: General;  Laterality: Right;  ? DILATATION & CURETTAGE/HYSTEROSCOPY WITH MYOSURE N/A 09/21/2017  ? Procedure: [place];  Surgeon: Servando Salina, MD;  Location: [facility];  Service: Gynecology;  Laterality: N/A;  ? THYROID SURGERY  11/02/2005  ? thyroid exploration with isthmusectomy  ? ? ?REVIEW OF SYSTEMS:   ?Review of Systems  ?Constitutional: Negative for appetite change, chills, fatigue, fever and unexpected weight change.  ?HENT: Negative for mouth sores, nosebleeds, sore throat and trouble swallowing.   ?Eyes: Negative for eye problems and icterus.  ?Respiratory: Negative for cough, hemoptysis, shortness of breath and wheezing.   ?Cardiovascular: Positive for right breast and axillary pain to palpation.  Negative for leg swelling.  ?Gastrointestinal: Negative for abdominal pain, constipation, diarrhea, nausea and vomiting.  ?Genitourinary: Negative for bladder incontinence, difficulty urinating, dysuria, frequency and hematuria.   ?Musculoskeletal: Decreased range of motion in right shoulder secondary to pain.  Negative for back pain, gait problem, neck pain and neck stiffness.  ?Skin: Negative for itching and rash.  ?Neurological: Negative for dizziness, extremity weakness, gait problem, headaches, light-headedness and seizures.  ?Hematological: Negative for adenopathy. Does not bruise/bleed easily.   ?Psychiatric/Behavioral: Negative for confusion, depression and sleep disturbance. The patient is not nervous/anxious.   ? ? ?PHYSICAL EXAMINATION:  ?There were no vitals taken for this visit. ? ?ECOG PERFORMANCE STATUS: 1 ? ?Physical Exam  ?Constitutional: Oriented to person, place, and time and well-developed, well-nourished, and in no distress.  ?HENT:  ?Head: Normocephalic and atraumatic.  ?Mouth/Throat: Oropharynx is clear and moist. No oropharyngeal exudate.  ?Eyes: Conjunctivae are normal. Right eye exhibits no discharge. Left eye exhibits no discharge. No scleral icterus.  ?Neck: Normal range of motion. Neck supple.  ?Cardiovascular: Normal rate, regular rhythm, normal heart sounds and intact distal pulses.   ?Pulmonary/Chest: Effort normal and breath sounds normal. No respiratory distress. No wheezes. No rales.  ?Abdominal: Soft. Bowel sounds are normal. Exhibits no distension and no mass. There is no tenderness.  ?Musculoskeletal: Normal range of motion. Exhibits no edema.  ?Lymphadenopathy:  ?  No cervical adenopathy.  ?Neurological: Alert and oriented to person, place, and time. Exhibits normal muscle tone. Gait normal. Coordination normal.  ?Skin: Skin is warm and dry. No rash noted. Not diaphoretic. No erythema. No pallor.  ?Psychiatric: Mood, memory and judgment normal.  ?Breast: Skin dimpling near the right nipple noted.  Significant tenderness to mild palpation  in the lateral right breast and mid axillary line of the right thorax.  No masses palpated.  No overlying skin changes such as rashes, swelling, or erythema.  No nipple discharge.  ?Vitals reviewed. ? ?LABORATORY DATA: ?Lab Results  ?Component Value Date  ? WBC 4.9 09/21/2017  ? HGB 15.3 (H) 05/22/2018  ? HCT 45.0 05/22/2018  ? MCV 87.9 09/21/2017  ? PLT 162 09/21/2017  ? ? ?  Chemistry   ?   ?Component Value Date/Time  ? NA 141 05/22/2018 0229  ? NA 143 04/04/2017 0856  ? K 3.4 (L) 05/22/2018 0229  ? K 4.1 04/04/2017 0856  ? CL 107  05/22/2018 0229  ? CO2 21 (L) 04/04/2017 0856  ? BUN 14 05/22/2018 0229  ? BUN 10.2 04/04/2017 0856  ? CREATININE 0.80 05/22/2018 0229  ? CREATININE 1.1 04/04/2017 0856  ?    ?Component Value Date/Time  ? CALCIUM 9.5 05

## 2022-02-02 NOTE — Telephone Encounter (Signed)
Return call to pt, pt states she is having intense pain in her right breast and underarm area that began 3/21, no redness or warmth that pt is aware of.  I offered pt an appointment to be seen tomorrow for further symptom assessment, pt verbalized acceptance, understanding, and thanks  ?

## 2022-02-03 ENCOUNTER — Telehealth: Payer: Self-pay

## 2022-02-03 ENCOUNTER — Other Ambulatory Visit: Payer: Self-pay | Admitting: Physician Assistant

## 2022-02-03 ENCOUNTER — Telehealth: Payer: Self-pay | Admitting: Physician Assistant

## 2022-02-03 ENCOUNTER — Inpatient Hospital Stay: Payer: No Typology Code available for payment source | Attending: Physician Assistant | Admitting: Physician Assistant

## 2022-02-03 ENCOUNTER — Other Ambulatory Visit: Payer: Self-pay | Admitting: Hematology and Oncology

## 2022-02-03 ENCOUNTER — Other Ambulatory Visit: Payer: Self-pay

## 2022-02-03 ENCOUNTER — Other Ambulatory Visit: Payer: Self-pay | Admitting: Family Medicine

## 2022-02-03 ENCOUNTER — Encounter: Payer: Self-pay | Admitting: Physician Assistant

## 2022-02-03 VITALS — BP 129/80 | HR 92 | Temp 98.0°F | Resp 17 | Ht 60.0 in | Wt 122.3 lb

## 2022-02-03 DIAGNOSIS — M79621 Pain in right upper arm: Secondary | ICD-10-CM

## 2022-02-03 DIAGNOSIS — Z923 Personal history of irradiation: Secondary | ICD-10-CM | POA: Diagnosis not present

## 2022-02-03 DIAGNOSIS — Z170 Estrogen receptor positive status [ER+]: Secondary | ICD-10-CM

## 2022-02-03 DIAGNOSIS — R232 Flushing: Secondary | ICD-10-CM | POA: Insufficient documentation

## 2022-02-03 DIAGNOSIS — Z79811 Long term (current) use of aromatase inhibitors: Secondary | ICD-10-CM | POA: Insufficient documentation

## 2022-02-03 DIAGNOSIS — C50411 Malignant neoplasm of upper-outer quadrant of right female breast: Secondary | ICD-10-CM | POA: Insufficient documentation

## 2022-02-03 MED ORDER — LIDOCAINE 5 % EX PTCH: 1.0000 | MEDICATED_PATCH | CUTANEOUS | 0 refills | Status: DC

## 2022-02-03 NOTE — Telephone Encounter (Signed)
Notified Patient of need for prior authorization approval for Lidocaine 5% Patches. Prior Authorization submitted through [email or web address] as instructed by [employer].  Will notify Patient of decision. No other needs or concerns voiced at this time. ?

## 2022-02-03 NOTE — Telephone Encounter (Signed)
Pts mammogram and [place] have been moved to 02/06/22. Pt is aware to arrive at 9am. ?

## 2022-02-03 NOTE — Telephone Encounter (Signed)
I let the patient know that her insurance not cover lidocaine patches.  Therefore, I discussed that she can pick these up over-the-counter.  She expressed understanding. ?

## 2022-02-06 ENCOUNTER — Other Ambulatory Visit: Payer: Self-pay

## 2022-02-06 ENCOUNTER — Telehealth: Payer: Self-pay | Admitting: Physician Assistant

## 2022-02-06 ENCOUNTER — Ambulatory Visit
Admission: RE | Admit: 2022-02-06 | Discharge: 2022-02-06 | Disposition: A | Discharge status: Home / Self Care | Payer: No Typology Code available for payment source | Source: Ambulatory Visit | Attending: Physician Assistant | Admitting: Physician Assistant

## 2022-02-06 DIAGNOSIS — Z170 Estrogen receptor positive status [ER+]: Secondary | ICD-10-CM

## 2022-02-06 DIAGNOSIS — M79621 Pain in right upper arm: Secondary | ICD-10-CM

## 2022-02-06 NOTE — Telephone Encounter (Signed)
I called the patient to let her know that her mammogram and ultrasound did not show any suspicious findings to explain her breast pain. No evidence of recurrence. I called the patient. She states the ibuprofen, tylenol, and lidocaine patches are helping. She states they are going to call her from the breast cancer to schedule her left breast mammogram in May 2023. We will keep her follow up with Dr. Lindi Adie for 06/05/22 as scheduled. She knows to call if new or worsening symptoms.  ?

## 2022-02-20 ENCOUNTER — Other Ambulatory Visit: Payer: Self-pay | Admitting: Family Medicine

## 2022-02-20 DIAGNOSIS — N6489 Other specified disorders of breast: Secondary | ICD-10-CM

## 2022-02-20 DIAGNOSIS — Z09 Encounter for follow-up examination after completed treatment for conditions other than malignant neoplasm: Secondary | ICD-10-CM

## 2022-03-11 ENCOUNTER — Other Ambulatory Visit: Payer: Self-pay | Admitting: Hematology and Oncology

## 2022-04-07 ENCOUNTER — Ambulatory Visit: Payer: No Typology Code available for payment source

## 2022-06-05 ENCOUNTER — Telehealth: Payer: Self-pay | Admitting: Hematology and Oncology

## 2022-06-05 ENCOUNTER — Inpatient Hospital Stay
Payer: No Typology Code available for payment source | Attending: Hematology and Oncology | Admitting: Hematology and Oncology

## 2022-06-05 NOTE — Telephone Encounter (Signed)
7/24 in basket called pt about r/s a missed appointment  r/s per pt

## 2022-06-05 NOTE — Assessment & Plan Note (Deleted)
04/30/2017 Right lumpectomy: IDC grade 1, 1.4 cm, DCIS grade 1, margins negative, N0 stage IA, ER 90%, PR 90%, HER-2 negative, Ki-67 15% Oncotype DX score 16, low risk, 10% ROR Adjuvant radiation 05/31/2017 completed 07/12/2017  Current treatment: Antiestrogen therapy with tamoxifen 20 mg daily started 9/4/2018switch to anastrozole 6/11/2020switched to letrozole 07/23/2019  Letrozoletoxicities: Severe hot flashes I recommended holding letrozole for 2 weeks and then start taking it in the morning.  If she persists to have hot flashes then we can switch her to exemestane.  Otherwise we can send a prescription for Effexor.  She will call us in a month to update her status.  Breast cancer surveillance: 1.Breast exam  06/05/2022: Benign, breast tenderness right breast at the lumpectomy site probably from scar tissue 2.Mammogram3/27/2023: No mammographic evidence of malignancy to explain the cause of the left breast pain, breast density categoryB next mammogram bilateral will be in November 2023.  Return to clinic in 1 year for follow-up or sooner if needed

## 2022-06-23 ENCOUNTER — Telehealth: Payer: Self-pay | Admitting: Hematology and Oncology

## 2022-06-23 NOTE — Telephone Encounter (Signed)
Per 8/11 phone line pt called to cancel appointment

## 2022-06-27 ENCOUNTER — Inpatient Hospital Stay: Payer: No Typology Code available for payment source | Admitting: Hematology and Oncology

## 2022-09-07 ENCOUNTER — Other Ambulatory Visit: Payer: Self-pay | Admitting: Hematology and Oncology

## 2022-10-06 NOTE — Progress Notes (Signed)
Patient Care Team: [name], MD as PCP - General (Family Medicine) Servando Salina, MD as Consulting Physician (Obstetrics and Gynecology) Erroll Luna, MD as Consulting Physician (General Surgery) Nicholas Lose, MD as Consulting Physician (Hematology and Oncology) Kyung Rudd, MD as Consulting Physician (Radiation Oncology) Delice Bison Charlestine Massed, NP as Nurse Practitioner (Hematology and Oncology)  DIAGNOSIS:  Encounter Diagnoses  Name Primary?   Malignant neoplasm of upper-outer quadrant of right breast in female, estrogen receptor positive ([place]) Yes   Post-menopausal     SUMMARY OF ONCOLOGIC HISTORY: Oncology History  Malignant neoplasm of upper-outer quadrant of right breast in female, estrogen receptor positive ([place])  03/26/2017 Initial Diagnosis   Screening detected right breast breast density 1.4 cm, axilla negative, biopsy: Grade 2 IDC with DCIS ER 90%, PR 90%, HER-2 negative ratio 1.05, Ki-67 15%, T1c N0 stage IA clinical stage   04/30/2017 Surgery   Right lumpectomy: IDC grade 1, 1.4 cm, DCIS grade 1, margins negative, N0 stage IA, ER 90%, PR 90%, HER-2 negative, Ki-67 15%   05/04/2017 Oncotype testing   Oncotype score 16, low risk, 10% ROR   05/31/2017 - 07/13/2017 Radiation Therapy   Adjuvant radiation therapy   07/2017 -  Anti-estrogen oral therapy   Tamoxifen daily, switched to anastrozole after entering menopause on 04/24/19, switched to letrozole 07/23/19 due to hand cramping and joint pain while on anastrozole     CHIEF COMPLIANT: Follow-up right breast cancer surveillance  INTERVAL HISTORY: Brandi Lewis is a 56 y.o with the above-mentioned right breast cancer currently on letrozole therapy. She presents to the clinic for a follow-up. She states the hot flashes has subsided. She is tolerating the anastrozole extremely well. She denies any pain or discomfort in breast.    ALLERGIES:  is allergic to codeine.  MEDICATIONS:  Current  Outpatient Medications  Medication Sig Dispense Refill   cholecalciferol (VITAMIN D) 1000 units tablet Take 1,000 Units by mouth at bedtime.     EMGALITY 120 MG/ML SOAJ Inject into the skin.     lidocaine (LIDODERM) 5 % Place 1 patch onto the skin daily. Remove & Discard patch within 12 hours or as directed by MD 30 patch 0   tiZANidine (ZANAFLEX) 4 MG capsule Take 4 mg daily as needed by mouth for muscle spasms.      topiramate ER (QUDEXY XR) 200 MG CS24 sprinkle capsule Take 200 mg by mouth at bedtime.      Ubrogepant (UBRELVY) 50 MG TABS Ubrelvy 50 mg tablet  TAKE 1 TABLET BY MOUTH AS NEEDED FOR MIGRAINE. MAY REPEAT DOSE AFTER 2 HOURS     letrozole (FEMARA) 2.5 MG tablet TAKE 1 TABLET(2.5 MG) BY MOUTH DAILY 90 tablet 3   No current facility-administered medications for this visit.    PHYSICAL EXAMINATION: ECOG PERFORMANCE STATUS: 1 - Symptomatic but completely ambulatory  Vitals:   10/10/22 1407  BP: 134/78  Pulse: 72  Resp: 18  Temp: 97.7 F (36.5 C)  SpO2: 100%   Filed Weights   10/10/22 1407  Weight: 117 lb 4.8 oz (53.2 kg)    BREAST: No palpable masses or nodules in either right or left breasts. No palpable axillary supraclavicular or infraclavicular adenopathy no breast tenderness or nipple discharge. (exam performed in the presence of a chaperone)  LABORATORY DATA:  I have reviewed the data as listed    Latest Ref Rng & Units 05/22/2018    2:29 AM 04/04/2017    8:56 AM 09/27/2016    8:20  AM  CMP  Glucose 70 - 99 mg/dL 125  100  87   BUN 6 - 20 mg/dL 14  10.2  11   Creatinine 0.44 - 1.00 mg/dL 0.80  1.1  1.10   Sodium 135 - 145 mmol/L 141  143  142   Potassium 3.5 - 5.1 mmol/L 3.4  4.1  4.4   Chloride 98 - 111 mmol/L 107   112   CO2 22 - 29 mEq/L  21    Calcium 8.4 - 10.4 mg/dL  9.5    Total Protein 6.4 - 8.3 g/dL  7.5    Total Bilirubin 0.20 - 1.20 mg/dL  0.47    Alkaline Phos 40 - 150 U/L  70    AST 5 - 34 U/L  31    ALT 0 - 55 U/L  34      Lab Results   Component Value Date   WBC 5.4 10/10/2022   HGB 13.8 10/10/2022   HCT 41.6 10/10/2022   MCV 89.1 10/10/2022   PLT 224 10/10/2022   NEUTROABS 2.8 10/10/2022    ASSESSMENT & PLAN:  Malignant neoplasm of upper-outer quadrant of right breast in female, estrogen receptor positive ([place]) 04/30/2017 Right lumpectomy: IDC grade 1, 1.4 cm, DCIS grade 1, margins negative, N0 stage IA, ER 90%, PR 90%, HER-2 negative, Ki-67 15% Oncotype DX score 16, low risk, 10% ROR Adjuvant radiation 05/31/2017 completed 07/12/2017   Current treatment: Antiestrogen therapy with tamoxifen 20 mg daily started 07/17/2017 switch to anastrozole 04/24/2019 switched to letrozole 07/23/2019   Letrozole toxicities: Hot flashes have become manageable. She completed 5 years of letrozole.  We decided to extend it to 7 years.   Breast cancer surveillance: 1.  Breast exam 10/10/2022: Benign, breast tenderness right breast at the lumpectomy site probably from scar tissue 2. Mammogram scheduled for 10/13/2022 We will get a bone density test in next year.  Return to clinic in 1 year for follow-up      Orders Placed This Encounter  Procedures   DG Bone Density    Standing Status:   Future    Standing Expiration Date:   10/10/2023    Order Specific Question:   Reason for Exam (SYMPTOM  OR DIAGNOSIS REQUIRED)    Answer:   post menopausal    Order Specific Question:   Is the patient pregnant?    Answer:   No    Order Specific Question:   Preferred imaging location?    Answer:   [facility]    Order Specific Question:   Release to patient    Answer:   Immediate   The patient has a good understanding of the overall plan. she agrees with it. she will call with any problems that may develop before the next visit here. Total time spent: 30 mins including face to face time and time spent for planning, charting and co-ordination of care   Harriette Ohara, MD 10/10/22    I Gardiner Coins am scribing for Dr.  Lindi Adie  I have reviewed the above documentation for accuracy and completeness, and I agree with the above.

## 2022-10-09 ENCOUNTER — Other Ambulatory Visit: Payer: No Typology Code available for payment source

## 2022-10-10 ENCOUNTER — Inpatient Hospital Stay: Payer: No Typology Code available for payment source

## 2022-10-10 ENCOUNTER — Other Ambulatory Visit: Payer: Self-pay | Admitting: *Deleted

## 2022-10-10 ENCOUNTER — Inpatient Hospital Stay
Payer: No Typology Code available for payment source | Attending: Hematology and Oncology | Admitting: Hematology and Oncology

## 2022-10-10 VITALS — BP 134/78 | HR 72 | Temp 97.7°F | Resp 18 | Ht 60.0 in | Wt 117.3 lb

## 2022-10-10 DIAGNOSIS — Z170 Estrogen receptor positive status [ER+]: Secondary | ICD-10-CM

## 2022-10-10 DIAGNOSIS — N951 Menopausal and female climacteric states: Secondary | ICD-10-CM | POA: Insufficient documentation

## 2022-10-10 DIAGNOSIS — C50411 Malignant neoplasm of upper-outer quadrant of right female breast: Secondary | ICD-10-CM | POA: Insufficient documentation

## 2022-10-10 DIAGNOSIS — Z79811 Long term (current) use of aromatase inhibitors: Secondary | ICD-10-CM | POA: Insufficient documentation

## 2022-10-10 DIAGNOSIS — Z923 Personal history of irradiation: Secondary | ICD-10-CM | POA: Diagnosis not present

## 2022-10-10 DIAGNOSIS — Z780 Asymptomatic menopausal state: Secondary | ICD-10-CM

## 2022-10-10 LAB — CMP (CANCER CENTER ONLY)
ALT: 17 U/L (ref 0–44)
AST: 20 U/L (ref 15–41)
Albumin: 5 g/dL (ref 3.5–5.0)
Alkaline Phosphatase: 68 U/L (ref 38–126)
Anion gap: 8 (ref 5–15)
BUN: 12 mg/dL (ref 6–20)
CO2: 26 mmol/L (ref 22–32)
Calcium: 10.5 mg/dL — ABNORMAL HIGH (ref 8.9–10.3)
Chloride: 108 mmol/L (ref 98–111)
Creatinine: 1.02 mg/dL — ABNORMAL HIGH (ref 0.44–1.00)
GFR, Estimated: >60 mL/min (ref >60)
Glucose, Bld: 93 mg/dL (ref 70–99)
Potassium: 4.2 mmol/L (ref 3.5–5.1)
Sodium: 142 mmol/L (ref 135–145)
Total Bilirubin: 0.5 mg/dL (ref 0.3–1.2)
Total Protein: 8.2 g/dL — ABNORMAL HIGH (ref 6.5–8.1)

## 2022-10-10 LAB — CBC WITH DIFFERENTIAL (CANCER CENTER ONLY)
Abs Immature Granulocytes: 0.01 10*3/uL (ref 0.00–0.07)
Basophils Absolute: 0 10*3/uL (ref 0.0–0.1)
Basophils Relative: 1 %
Eosinophils Absolute: 0 10*3/uL (ref 0.0–0.5)
Eosinophils Relative: 1 %
HCT: 41.6 % (ref 36.0–46.0)
Hemoglobin: 13.8 g/dL (ref 12.0–15.0)
Immature Granulocytes: 0 %
Lymphocytes Relative: 39 %
Lymphs Abs: 2.1 10*3/uL (ref 0.7–4.0)
MCH: 29.6 pg (ref 26.0–34.0)
MCHC: 33.2 g/dL (ref 30.0–36.0)
MCV: 89.1 fL (ref 80.0–100.0)
Monocytes Absolute: 0.4 10*3/uL (ref 0.1–1.0)
Monocytes Relative: 8 %
Neutro Abs: 2.8 10*3/uL (ref 1.7–7.7)
Neutrophils Relative %: 51 %
Platelet Count: 224 10*3/uL (ref 150–400)
RBC: 4.67 MIL/uL (ref 3.87–5.11)
RDW: 13.6 % (ref 11.5–15.5)
WBC Count: 5.4 10*3/uL (ref 4.0–10.5)
nRBC: 0 % (ref 0.0–0.2)

## 2022-10-10 MED ORDER — LETROZOLE 2.5 MG PO TABS: ORAL_TABLET | ORAL | 3 refills | Status: DC

## 2022-10-10 NOTE — Assessment & Plan Note (Addendum)
04/30/2017 Right lumpectomy: IDC grade 1, 1.4 cm, DCIS grade 1, margins negative, N0 stage IA, ER 90%, PR 90%, HER-2 negative, Ki-67 15% Oncotype DX score 16, low risk, 10% ROR Adjuvant radiation 05/31/2017 completed 07/12/2017   Current treatment: Antiestrogen therapy with tamoxifen 20 mg daily started 07/17/2017 switch to anastrozole 04/24/2019 switched to letrozole 07/23/2019   Letrozole toxicities: Hot flashes have become manageable. She completed 5 years of letrozole.  We decided to extend it to 7 years.   Breast cancer surveillance: 1.  Breast exam 10/10/2022: Benign, breast tenderness right breast at the lumpectomy site probably from scar tissue 2. Mammogram scheduled for 10/13/2022 We will get a bone density test in next year.  Return to clinic in 1 year for follow-up

## 2022-10-13 ENCOUNTER — Ambulatory Visit
Admission: RE | Admit: 2022-10-13 | Discharge: 2022-10-13 | Disposition: A | Discharge status: Home / Self Care | Payer: No Typology Code available for payment source | Source: Ambulatory Visit | Attending: Family Medicine | Admitting: Family Medicine

## 2022-10-13 DIAGNOSIS — Z09 Encounter for follow-up examination after completed treatment for conditions other than malignant neoplasm: Secondary | ICD-10-CM

## 2022-10-13 DIAGNOSIS — N6489 Other specified disorders of breast: Secondary | ICD-10-CM

## 2022-11-21 ENCOUNTER — Ambulatory Visit (HOSPITAL_BASED_OUTPATIENT_CLINIC_OR_DEPARTMENT_OTHER): Payer: No Typology Code available for payment source

## 2022-11-22 ENCOUNTER — Ambulatory Visit
Admission: RE | Admit: 2022-11-22 | Discharge: 2022-11-22 | Discharge status: Home / Self Care | Payer: No Typology Code available for payment source | Source: Ambulatory Visit | Attending: Hematology and Oncology

## 2022-11-22 DIAGNOSIS — Z780 Asymptomatic menopausal state: Secondary | ICD-10-CM

## 2023-08-15 ENCOUNTER — Telehealth: Payer: Self-pay | Admitting: Hematology and Oncology

## 2023-08-15 NOTE — Telephone Encounter (Signed)
Patient is aware of rescheduled appointment times/dates due to provider being out of office 10/12/2023

## 2023-10-12 ENCOUNTER — Ambulatory Visit: Payer: No Typology Code available for payment source | Admitting: Hematology and Oncology

## 2023-10-15 ENCOUNTER — Inpatient Hospital Stay: Payer: No Typology Code available for payment source | Admitting: Hematology and Oncology

## 2023-11-28 ENCOUNTER — Other Ambulatory Visit: Payer: Self-pay | Admitting: Obstetrics and Gynecology

## 2023-11-28 ENCOUNTER — Telehealth: Payer: Self-pay | Admitting: Hematology and Oncology

## 2023-11-28 DIAGNOSIS — Z1231 Encounter for screening mammogram for malignant neoplasm of breast: Secondary | ICD-10-CM

## 2023-11-28 NOTE — Telephone Encounter (Signed)
 Patient called to reschedule missed appt. Appointment scheduled to review mammogram results.

## 2023-12-01 ENCOUNTER — Other Ambulatory Visit: Payer: Self-pay | Admitting: Hematology and Oncology

## 2023-12-07 ENCOUNTER — Other Ambulatory Visit: Payer: Self-pay | Admitting: Obstetrics and Gynecology

## 2023-12-07 DIAGNOSIS — Z09 Encounter for follow-up examination after completed treatment for conditions other than malignant neoplasm: Secondary | ICD-10-CM

## 2023-12-13 ENCOUNTER — Ambulatory Visit: Payer: No Typology Code available for payment source

## 2023-12-13 ENCOUNTER — Other Ambulatory Visit: Payer: Self-pay | Admitting: Obstetrics and Gynecology

## 2023-12-13 DIAGNOSIS — N6002 Solitary cyst of left breast: Secondary | ICD-10-CM

## 2023-12-17 NOTE — Assessment & Plan Note (Signed)
04/30/2017 Right lumpectomy: IDC grade 1, 1.4 cm, DCIS grade 1, margins negative, N0 stage IA, ER 90%, PR 90%, HER-2 negative, Ki-67 15% Oncotype DX score 16, low risk, 10% ROR Adjuvant radiation 05/31/2017 completed 07/12/2017   Current treatment: Antiestrogen therapy with tamoxifen 20 mg daily started 07/17/2017 switch to anastrozole 04/24/2019 switched to letrozole 07/23/2019 to continue until 2027   Letrozole toxicities: Hot flashes have become manageable.    Breast cancer surveillance: 1.  Breast exam 12/19/2023: Benign, breast tenderness right breast at the lumpectomy site probably from scar tissue 2. Mammogram and bone density scheduled for 12/31/2023    Return to clinic in 1 year for follow-up

## 2023-12-19 ENCOUNTER — Inpatient Hospital Stay
Payer: No Typology Code available for payment source | Attending: Hematology and Oncology | Admitting: Hematology and Oncology

## 2023-12-19 VITALS — BP 135/76 | HR 90 | Temp 98.1°F | Resp 19 | Ht 60.0 in | Wt 122.6 lb

## 2023-12-19 DIAGNOSIS — C50411 Malignant neoplasm of upper-outer quadrant of right female breast: Secondary | ICD-10-CM | POA: Insufficient documentation

## 2023-12-19 DIAGNOSIS — Z79811 Long term (current) use of aromatase inhibitors: Secondary | ICD-10-CM | POA: Insufficient documentation

## 2023-12-19 DIAGNOSIS — Z170 Estrogen receptor positive status [ER+]: Secondary | ICD-10-CM | POA: Diagnosis not present

## 2023-12-19 DIAGNOSIS — M8580 Other specified disorders of bone density and structure, unspecified site: Secondary | ICD-10-CM | POA: Insufficient documentation

## 2023-12-19 MED ORDER — ALENDRONATE SODIUM 70 MG PO TABS: 70.0000 mg | ORAL_TABLET | ORAL | 3 refills | Status: AC

## 2023-12-19 NOTE — Progress Notes (Signed)
 Patient Care Team: Waylan Almarie SAUNDERS, MD as PCP - General (Family Medicine) Rutherford Gain, MD as Consulting Physician (Obstetrics and Gynecology) Vanderbilt Ned, MD as Consulting Physician (General Surgery) Odean Potts, MD as Consulting Physician (Hematology and Oncology) Dewey Rush, MD as Consulting Physician (Radiation Oncology) Crawford Morna Pickle, NP as Nurse Practitioner (Hematology and Oncology)  DIAGNOSIS:  Encounter Diagnosis  Name Primary?   Malignant neoplasm of upper-outer quadrant of right breast in female, estrogen receptor positive ([facility]) Yes    SUMMARY OF ONCOLOGIC HISTORY: Oncology History  Malignant neoplasm of upper-outer quadrant of right breast in female, estrogen receptor positive ([facility])  03/26/2017 Initial Diagnosis   Screening detected right breast breast density 1.4 cm, axilla negative, biopsy: Grade 2 IDC with DCIS ER 90%, PR 90%, HER-2 negative ratio 1.05, Ki-67 15%, T1c N0 stage IA clinical stage   04/30/2017 Surgery   Right lumpectomy: IDC grade 1, 1.4 cm, DCIS grade 1, margins negative, N0 stage IA, ER 90%, PR 90%, HER-2 negative, Ki-67 15%   05/04/2017 Oncotype testing   Oncotype score 16, low risk, 10% ROR   05/31/2017 - 07/13/2017 Radiation Therapy   Adjuvant radiation therapy   07/2017 -  Anti-estrogen oral therapy   Tamoxifen  daily, switched to anastrozole  after entering menopause on 04/24/19, switched to letrozole  07/23/19 due to hand cramping and joint pain while on anastrozole      CHIEF COMPLIANT: Follow-up on anastrozole  therapy  HISTORY OF PRESENT ILLNESS:  History of Present Illness   Brandi Lewis is a 58 year old female with breast cancer who presents for a follow-up visit.  She has been on letrozole  since 2020 following her breast cancer diagnosis in 2018. She experiences manageable hot flashes and some pain on the side of her breast, which she notes is not uncommon. No current hot flashes.  Her upcoming mammogram was  rescheduled to a diagnostic mammogram on February 17th due to a finding on her left breast that requires follow-up. She was informed of this change when she went for her initial appointment.  She has a history of osteopenia with a T-score of minus two from a bone density scan conducted last year. She is not currently taking any medication for her bones. She has no dental issues or pain and has had a recent dental check-up.  She engages in physical activity by walking and has no changes in her medication list except for discontinuing lidocaine .         ALLERGIES:  is allergic to codeine.  MEDICATIONS:  Current Outpatient Medications  Medication Sig Dispense Refill   alendronate  (FOSAMAX ) 70 MG tablet Take 1 tablet (70 mg total) by mouth once a week. Take with a full glass of water on an empty stomach. 12 tablet 3   cholecalciferol (VITAMIN D) 1000 units tablet Take 1,000 Units by mouth at bedtime.     EMGALITY 120 MG/ML SOAJ Inject into the skin.     letrozole  (FEMARA ) 2.5 MG tablet TAKE 1 TABLET(2.5 MG) BY MOUTH DAILY 90 tablet 3   tiZANidine (ZANAFLEX) 4 MG capsule Take 4 mg daily as needed by mouth for muscle spasms.      topiramate  ER (QUDEXY  XR) 200 MG CS24 sprinkle capsule Take 200 mg by mouth at bedtime.      Ubrogepant  (UBRELVY ) 50 MG TABS Ubrelvy  50 mg tablet  TAKE 1 TABLET BY MOUTH AS NEEDED FOR MIGRAINE. MAY REPEAT DOSE AFTER 2 HOURS     No current facility-administered medications for this visit.  PHYSICAL EXAMINATION: ECOG PERFORMANCE STATUS: 1 - Symptomatic but completely ambulatory  Vitals:   12/19/23 0846  BP: 135/76  Pulse: 90  Resp: 19  Temp: 98.1 F (36.7 C)  SpO2: 100%   Filed Weights   12/19/23 0846  Weight: 122 lb 9.6 oz (55.6 kg)    Physical Exam   BREAST: Tenderness on palpation on the side of the breast.      (exam performed in the presence of a chaperone)  LABORATORY DATA:  I have reviewed the data as listed    Latest Ref Rng & Units  10/10/2022    1:53 PM 05/22/2018    2:29 AM 04/04/2017    8:56 AM  CMP  Glucose 70 - 99 mg/dL 93  874  899   BUN 6 - 20 mg/dL 12  14  89.7   Creatinine 0.44 - 1.00 mg/dL 8.97  9.19  1.1   Sodium 135 - 145 mmol/L 142  141  143   Potassium 3.5 - 5.1 mmol/L 4.2  3.4  4.1   Chloride 98 - 111 mmol/L 108  107    CO2 22 - 32 mmol/L 26   21   Calcium 8.9 - 10.3 mg/dL 89.4   9.5   Total Protein 6.5 - 8.1 g/dL 8.2   7.5   Total Bilirubin 0.3 - 1.2 mg/dL 0.5   9.52   Alkaline Phos 38 - 126 U/L 68   70   AST 15 - 41 U/L 20   31   ALT 0 - 44 U/L 17   34     Lab Results  Component Value Date   WBC 5.4 10/10/2022   HGB 13.8 10/10/2022   HCT 41.6 10/10/2022   MCV 89.1 10/10/2022   PLT 224 10/10/2022   NEUTROABS 2.8 10/10/2022    ASSESSMENT & PLAN:  Malignant neoplasm of upper-outer quadrant of right breast in female, estrogen receptor positive ([facility]) 04/30/2017 Right lumpectomy: IDC grade 1, 1.4 cm, DCIS grade 1, margins negative, N0 stage IA, ER 90%, PR 90%, HER-2 negative, Ki-67 15% Oncotype DX score 16, low risk, 10% ROR Adjuvant radiation 05/31/2017 completed 07/12/2017   Current treatment: Antiestrogen therapy with tamoxifen  20 mg daily started 07/17/2017 switch to anastrozole  04/24/2019 switched to letrozole  07/23/2019 to continue until 2027   Letrozole  toxicities: Hot flashes have become manageable.    Breast cancer surveillance: 1.  Breast exam 12/19/2023: Benign, breast tenderness right breast at the lumpectomy site probably from scar tissue 2. Mammogram and bone density scheduled for 12/31/2023    Return to clinic in 1 year for follow-up   ------------------------------------- Assessment and Plan    Breast Cancer Follow-Up Diagnosed with breast cancer in 2018, currently on letrozole  with 2 years remaining. Reports mild breast pain, which is not uncommon. Upcoming diagnostic mammogram on February 17th due to a previous finding on the left breast. Discussed the importance of the  mammogram to ensure no recurrence or new issues. - Continue letrozole  - Perform diagnostic mammogram on February 17th  Osteopenia Bone density scan in 2024 showed a T score of -2. Discussed starting Fosamax  to prevent bone softening and fractures. Explained the need to take the medication with a large amount of water and to avoid lying down for 30 minutes post-ingestion to prevent esophageal irritation. Also discussed informing dental providers about the medication to avoid complications during dental procedures. No current dental issues, making her a good candidate for Fosamax . - Prescribe Fosamax  once weekly - Advise to take  Fosamax  with a large amount of water and avoid lying down for 30 minutes post-ingestion - Instruct to inform dental providers about Fosamax  use before any dental procedures  General Health Maintenance Engages in regular walking for exercise. Discontinued lidocaine . - Continue regular exercise - Remove lidocaine  from medication list  Follow-up - Schedule follow-up appointment in one year.          No orders of the defined types were placed in this encounter.  The patient has a good understanding of the overall plan. she agrees with it. she will call with any problems that may develop before the next visit here. Total time spent: 30 mins including face to face time and time spent for planning, charting and co-ordination of care   Viinay K Oleda Borski, MD 12/19/23

## 2023-12-31 ENCOUNTER — Ambulatory Visit
Admission: RE | Admit: 2023-12-31 | Discharge: 2023-12-31 | Disposition: A | Discharge status: Home / Self Care | Payer: No Typology Code available for payment source | Source: Ambulatory Visit | Attending: Obstetrics and Gynecology | Admitting: Obstetrics and Gynecology

## 2023-12-31 DIAGNOSIS — N6002 Solitary cyst of left breast: Secondary | ICD-10-CM

## 2024-12-05 ENCOUNTER — Other Ambulatory Visit: Payer: Self-pay | Admitting: Obstetrics and Gynecology

## 2024-12-05 DIAGNOSIS — Z1231 Encounter for screening mammogram for malignant neoplasm of breast: Secondary | ICD-10-CM

## 2024-12-22 ENCOUNTER — Inpatient Hospital Stay: Payer: No Typology Code available for payment source | Admitting: Hematology and Oncology

## 2025-01-07 ENCOUNTER — Ambulatory Visit
